# Patient Record
Sex: Female | Born: 1970 | Race: White | Hispanic: No | Marital: Single
Health system: Southern US, Community
[De-identification: ages and names within clinical notes are randomized; demographics above are authoritative.]

## PROBLEM LIST (undated history)

## (undated) DIAGNOSIS — S02609A Fracture of mandible, unspecified, initial encounter for closed fracture: Secondary | ICD-10-CM

## (undated) HISTORY — PX: TEMPOROMANDIBULAR JOINT SURGERY: SHX35

## (undated) HISTORY — PX: TUBAL LIGATION: SHX77

## (undated) HISTORY — PX: MANDIBLE FRACTURE SURGERY: SHX706

---

## 1998-01-22 ENCOUNTER — Emergency Department (HOSPITAL_COMMUNITY): Admission: EM | Admit: 1998-01-22 | Discharge: 1998-01-22 | Payer: Self-pay

## 1998-01-31 ENCOUNTER — Emergency Department (HOSPITAL_COMMUNITY): Admission: EM | Admit: 1998-01-31 | Discharge: 1998-01-31 | Payer: Self-pay | Admitting: Emergency Medicine

## 1998-04-18 ENCOUNTER — Emergency Department (HOSPITAL_COMMUNITY): Admission: EM | Admit: 1998-04-18 | Discharge: 1998-04-18 | Payer: Self-pay | Admitting: Emergency Medicine

## 1998-07-03 ENCOUNTER — Emergency Department (HOSPITAL_COMMUNITY): Admission: EM | Admit: 1998-07-03 | Discharge: 1998-07-03 | Payer: Self-pay | Admitting: Emergency Medicine

## 1998-07-03 ENCOUNTER — Encounter: Payer: Self-pay | Admitting: Emergency Medicine

## 1998-09-02 ENCOUNTER — Other Ambulatory Visit: Admission: RE | Admit: 1998-09-02 | Discharge: 1998-09-02 | Payer: Self-pay | Admitting: Obstetrics and Gynecology

## 1998-10-27 ENCOUNTER — Ambulatory Visit (HOSPITAL_COMMUNITY): Admission: RE | Admit: 1998-10-27 | Discharge: 1998-10-27 | Payer: Self-pay | Admitting: Obstetrics and Gynecology

## 1998-12-03 ENCOUNTER — Encounter: Payer: Self-pay | Admitting: Obstetrics and Gynecology

## 1998-12-03 ENCOUNTER — Ambulatory Visit (HOSPITAL_COMMUNITY): Admission: RE | Admit: 1998-12-03 | Discharge: 1998-12-03 | Payer: Self-pay | Admitting: Obstetrics and Gynecology

## 1999-01-08 ENCOUNTER — Inpatient Hospital Stay (HOSPITAL_COMMUNITY): Admission: AD | Admit: 1999-01-08 | Discharge: 1999-01-08 | Payer: Self-pay | Admitting: Obstetrics and Gynecology

## 1999-02-09 ENCOUNTER — Inpatient Hospital Stay (HOSPITAL_COMMUNITY): Admission: AD | Admit: 1999-02-09 | Discharge: 1999-02-09 | Payer: Self-pay | Admitting: *Deleted

## 1999-03-17 ENCOUNTER — Inpatient Hospital Stay (HOSPITAL_COMMUNITY): Admission: AD | Admit: 1999-03-17 | Discharge: 1999-03-17 | Payer: Self-pay | Admitting: Obstetrics and Gynecology

## 1999-03-20 ENCOUNTER — Inpatient Hospital Stay (HOSPITAL_COMMUNITY): Admission: AD | Admit: 1999-03-20 | Discharge: 1999-03-20 | Payer: Self-pay | Admitting: Obstetrics & Gynecology

## 1999-03-21 ENCOUNTER — Inpatient Hospital Stay (HOSPITAL_COMMUNITY): Admission: AD | Admit: 1999-03-21 | Discharge: 1999-03-24 | Payer: Self-pay | Admitting: *Deleted

## 1999-07-03 ENCOUNTER — Emergency Department (HOSPITAL_COMMUNITY): Admission: EM | Admit: 1999-07-03 | Discharge: 1999-07-03 | Payer: Self-pay | Admitting: *Deleted

## 2000-06-11 ENCOUNTER — Emergency Department (HOSPITAL_COMMUNITY): Admission: EM | Admit: 2000-06-11 | Discharge: 2000-06-11 | Payer: Self-pay | Admitting: Emergency Medicine

## 2000-10-23 ENCOUNTER — Emergency Department (HOSPITAL_COMMUNITY): Admission: EM | Admit: 2000-10-23 | Discharge: 2000-10-23 | Payer: Self-pay | Admitting: Emergency Medicine

## 2001-04-17 ENCOUNTER — Emergency Department (HOSPITAL_COMMUNITY): Admission: EM | Admit: 2001-04-17 | Discharge: 2001-04-17 | Payer: Self-pay | Admitting: Emergency Medicine

## 2001-12-18 ENCOUNTER — Emergency Department (HOSPITAL_COMMUNITY): Admission: EM | Admit: 2001-12-18 | Discharge: 2001-12-18 | Payer: Self-pay | Admitting: Emergency Medicine

## 2002-06-05 ENCOUNTER — Emergency Department (HOSPITAL_COMMUNITY): Admission: EM | Admit: 2002-06-05 | Discharge: 2002-06-05 | Payer: Self-pay | Admitting: Emergency Medicine

## 2003-04-12 ENCOUNTER — Emergency Department (HOSPITAL_COMMUNITY): Admission: EM | Admit: 2003-04-12 | Discharge: 2003-04-12 | Payer: Self-pay | Admitting: Emergency Medicine

## 2003-11-22 ENCOUNTER — Inpatient Hospital Stay (HOSPITAL_COMMUNITY): Admission: AD | Admit: 2003-11-22 | Discharge: 2003-11-22 | Payer: Self-pay | Admitting: Obstetrics and Gynecology

## 2003-12-30 ENCOUNTER — Ambulatory Visit (HOSPITAL_COMMUNITY): Admission: RE | Admit: 2003-12-30 | Discharge: 2003-12-30 | Payer: Self-pay | Admitting: Obstetrics and Gynecology

## 2004-02-02 ENCOUNTER — Inpatient Hospital Stay (HOSPITAL_COMMUNITY): Admission: AD | Admit: 2004-02-02 | Discharge: 2004-02-04 | Payer: Self-pay | Admitting: Obstetrics and Gynecology

## 2004-02-03 ENCOUNTER — Encounter (INDEPENDENT_AMBULATORY_CARE_PROVIDER_SITE_OTHER): Payer: Self-pay | Admitting: Specialist

## 2004-04-14 ENCOUNTER — Emergency Department (HOSPITAL_COMMUNITY): Admission: EM | Admit: 2004-04-14 | Discharge: 2004-04-14 | Payer: Self-pay | Admitting: Family Medicine

## 2004-11-02 ENCOUNTER — Emergency Department (HOSPITAL_COMMUNITY): Admission: EM | Admit: 2004-11-02 | Discharge: 2004-11-02 | Payer: Self-pay | Admitting: Emergency Medicine

## 2005-06-01 IMAGING — US US OB FOLLOW-UP
1 series · 13 of 28 positions shown · non-contrast
Comparison: none

CLINICAL DATA: Size less than dates.  Question IUGR.

[Series 1: unknown · 0.32mm/px · 13 of 44 slices shown]
[im 2/44]
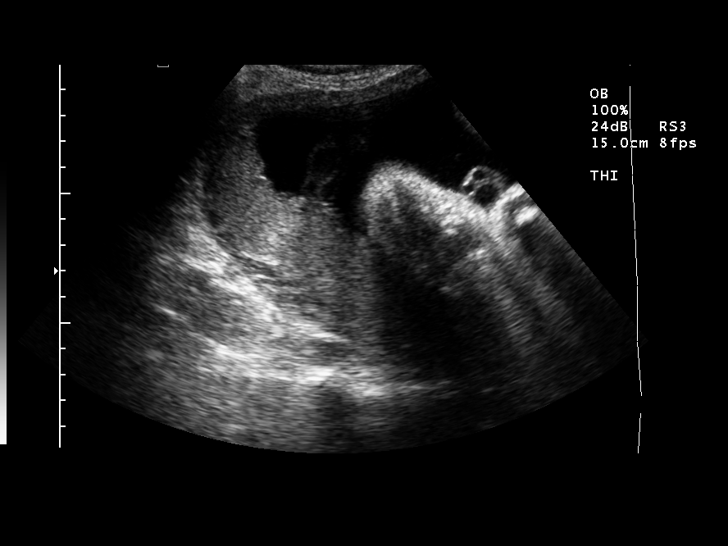
[im 5/44]
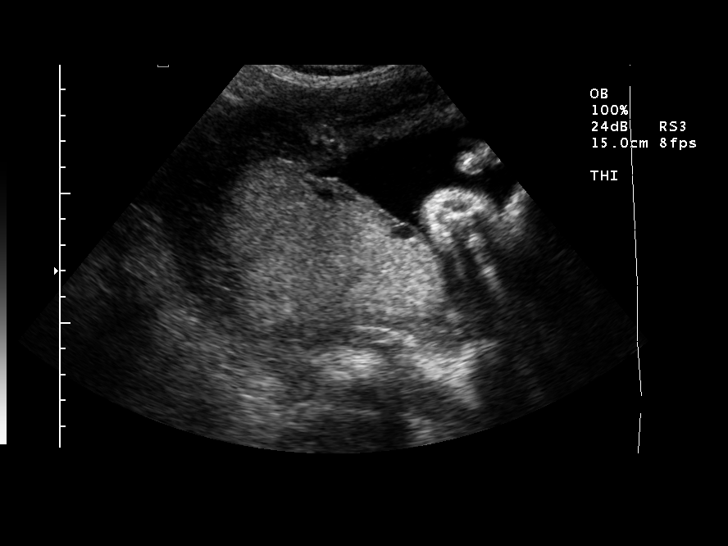
[im 8/44]
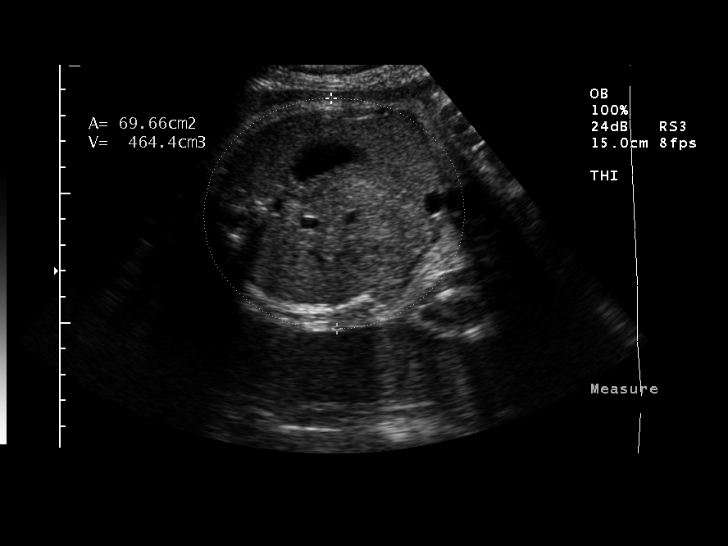
[im 12/44]
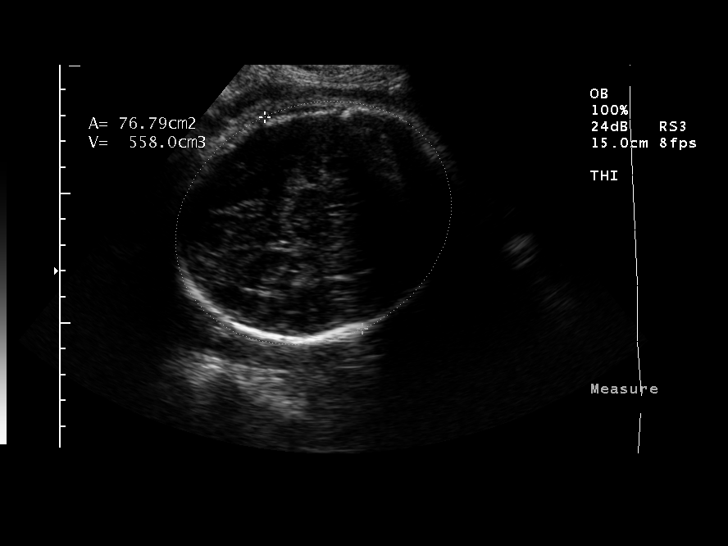
[im 15/44]
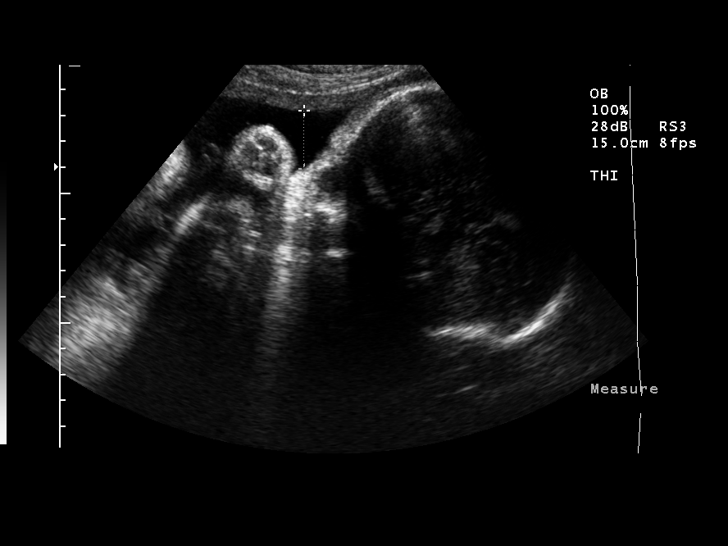
[im 18/44]
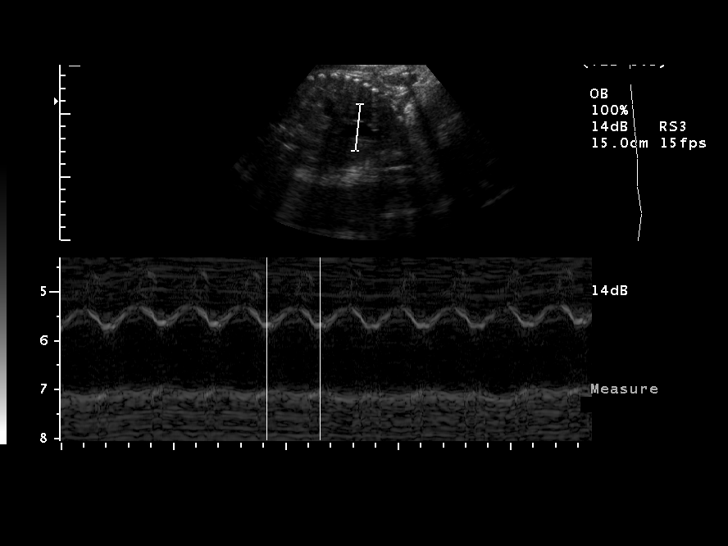
[im 23/44]
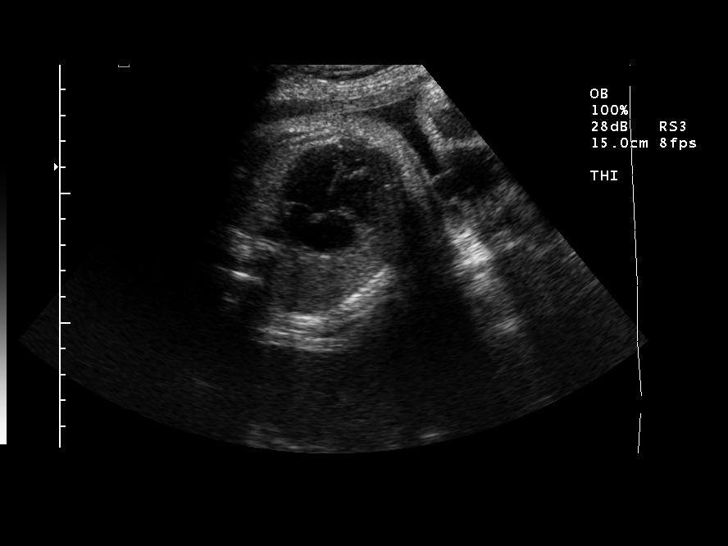
[im 26/44]
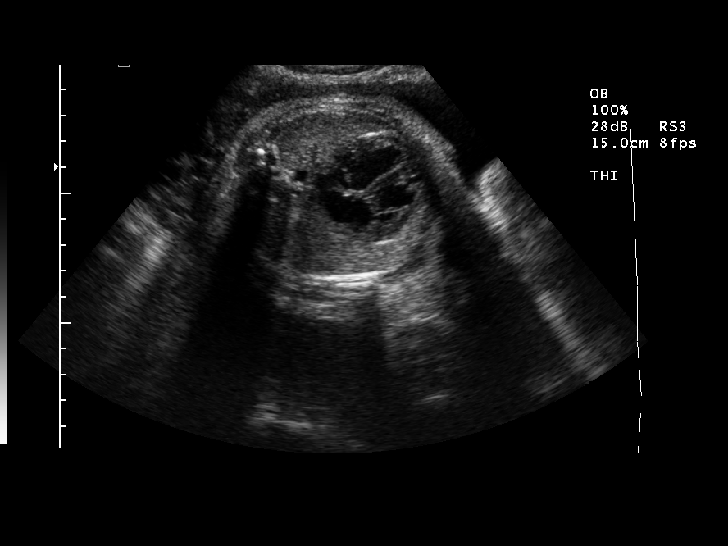
[im 29/44]
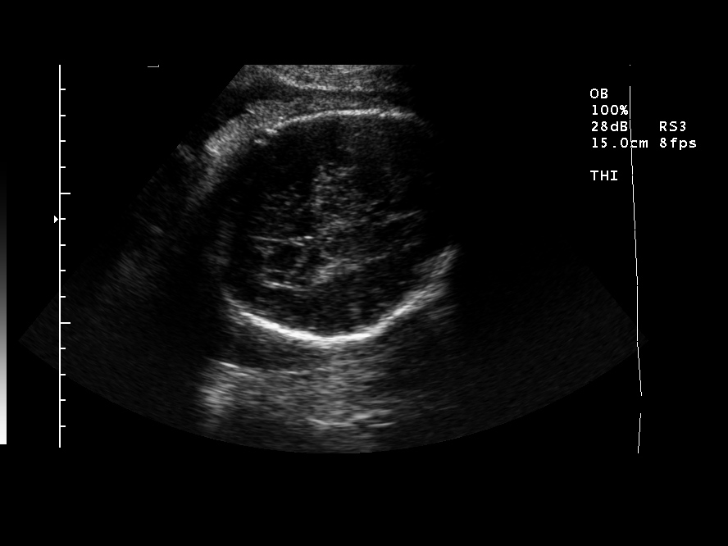
[im 32/44]
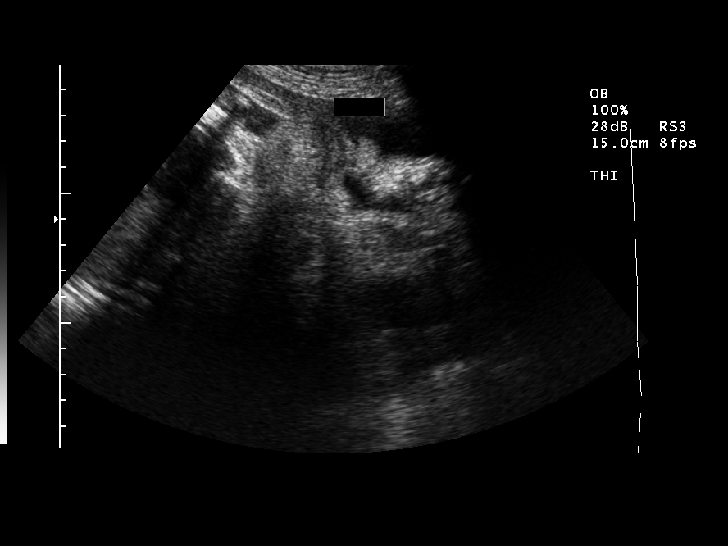
[im 36/44]
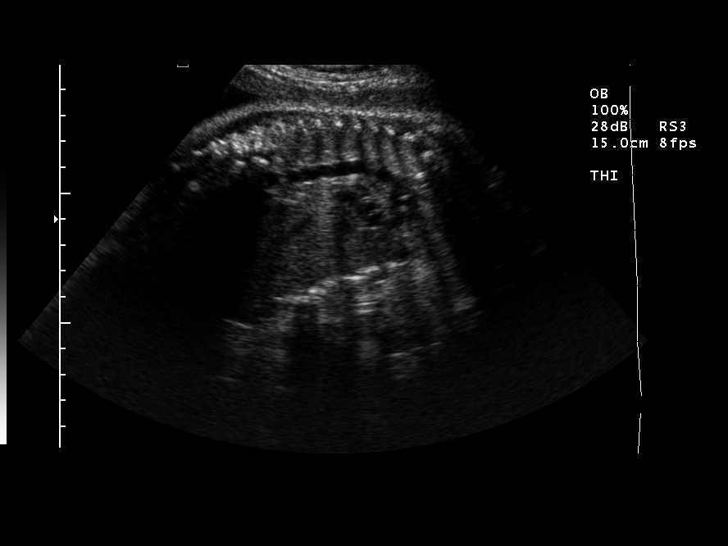
[im 39/44]
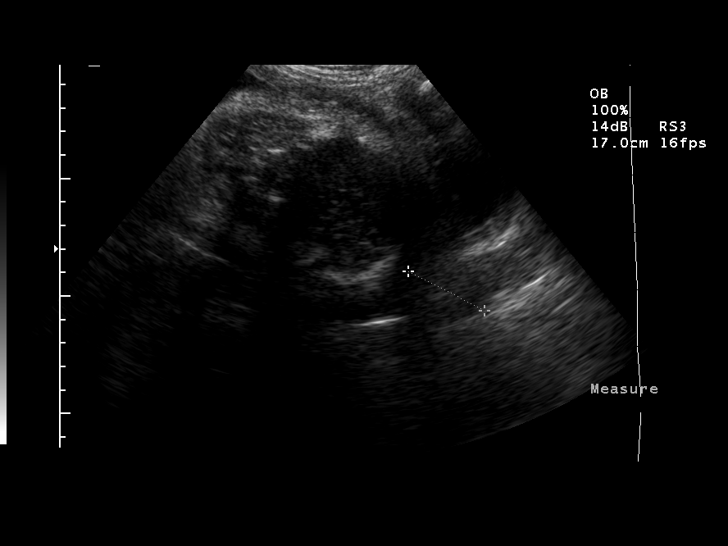
[im 42/44]
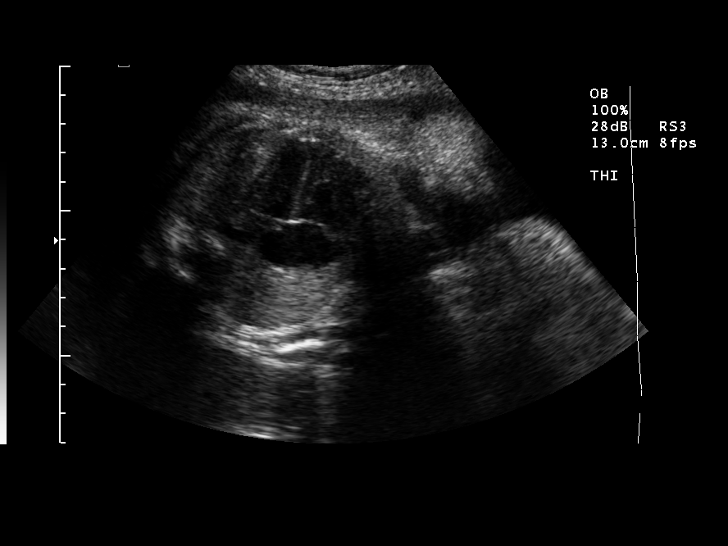

[13 of 28 positions shown; findings below may reference images not displayed]

OBSTETRICAL ULTRASOUND RE-EVALUATION
 Number of Fetuses:  1
 Heart Rate:  127
 Movement:  Yes
 Breathing:  Yes
 Presentation:  Cephalic 
 Placental Location:  Fundal, posterior
 Grade:  I
 Previa:  No
 Amniotic Fluid (subjective):  Normal
 Amniotic Fluid (objective):  15.5 cm AFI (5th -95th%ile =   8.3 – 24.5 cm for 33 wks)

 FETAL BIOMETRY
 BPD:  8.6 cm   34 w 6 d
 HC:  31.4 cm   35 w 2 d
 AC:  29.7 cm   33 w 5 d
 FL:   6.3 cm   33 w 0 d 

 Mean GA:  34 w 2 d
 Assigned GA:  33 w 3 d

 EFW:  0000 g (H) 75th – 90th%ile (4535 – 4040 g) for 33 wks

 FETAL ANATOMY
 Lateral Ventricles:  Visualized 
 Thalami/CSP:  Visualized 
 Posterior Fossa:  Visualized 
 Nuchal Region:  N/A
 Spine:  Previously seen 
 4 Chamber Heart on Left:  Visualized 
 Stomach on Left:  Visualized 
 3 Vessel Cord:  Visualized 
 Cord Insertion Site:  Previously seen 
 Kidneys:  Visualized 
 Bladder:  Visualized 
 Extremities:  Previously seen 

 ADDITIONAL ANATOMY VISUALIZED:  LVOT, RVOT, upper lip, orbits, diaphragm and male genitalia

 MATERNAL FINDINGS
 Cervix:  3.6 cm Transabdominally

 BIOPHYSICAL PROFILE

 Movement:  2    Time:  20 minutes
 Breathing:  2
 Tone:  2
 Amniotic Fluid:  2

 Total Score:  8
IMPRESSION: Single living intrauterine fetus in cephalic presentation with subjectively and quantitatively normal amniotic fluid volume.  Estimated gestational age by fetal biometry is 34 weeks 2 days which is within one week of the reported assigned gestational age of 33 weeks 3 days.  Best estimated fetal weight using the Srushti Gamel is 0000 grams which would place the fetus in the 75th – 90th percentile.
 Biophysical profile score is [DATE] after 20 minutes.

## 2006-09-25 ENCOUNTER — Emergency Department (HOSPITAL_COMMUNITY): Admission: EM | Admit: 2006-09-25 | Discharge: 2006-09-25 | Payer: Self-pay | Admitting: Emergency Medicine

## 2006-12-20 ENCOUNTER — Emergency Department (HOSPITAL_COMMUNITY): Admission: EM | Admit: 2006-12-20 | Discharge: 2006-12-20 | Payer: Self-pay | Admitting: Emergency Medicine

## 2009-10-20 ENCOUNTER — Emergency Department (HOSPITAL_COMMUNITY): Admission: EM | Admit: 2009-10-20 | Discharge: 2009-10-20 | Payer: Self-pay | Admitting: Emergency Medicine

## 2010-09-01 ENCOUNTER — Inpatient Hospital Stay (INDEPENDENT_AMBULATORY_CARE_PROVIDER_SITE_OTHER)
Admission: RE | Admit: 2010-09-01 | Discharge: 2010-09-01 | Disposition: A | Discharge status: Home / Self Care | Payer: Self-pay | Source: Ambulatory Visit | Attending: Family Medicine | Admitting: Family Medicine

## 2010-09-01 ENCOUNTER — Emergency Department (HOSPITAL_COMMUNITY)
Admission: EM | Admit: 2010-09-01 | Discharge: 2010-09-01 | Discharge status: Against Medical Advice | Payer: Self-pay | Attending: Emergency Medicine | Admitting: Emergency Medicine

## 2010-09-01 DIAGNOSIS — N926 Irregular menstruation, unspecified: Secondary | ICD-10-CM

## 2010-09-01 DIAGNOSIS — R079 Chest pain, unspecified: Secondary | ICD-10-CM | POA: Insufficient documentation

## 2010-09-01 LAB — POCT I-STAT, CHEM 8
BUN: 5 mg/dL — ABNORMAL LOW (ref 6–23)
Chloride: 103 mEq/L (ref 96–112)
Creatinine, Ser: 0.8 mg/dL (ref 0.4–1.2)
HCT: 33 % — ABNORMAL LOW (ref 36.0–46.0)
Hemoglobin: 11.2 g/dL — ABNORMAL LOW (ref 12.0–15.0)
Potassium: 4.1 mEq/L (ref 3.5–5.1)
TCO2: 24 mmol/L (ref 0–100)

## 2010-09-22 LAB — POCT URINALYSIS DIP (DEVICE)
Protein, ur: NEGATIVE mg/dL
Specific Gravity, Urine: 1.01 (ref 1.005–1.030)
Urobilinogen, UA: 0.2 mg/dL (ref 0.0–1.0)

## 2010-09-22 LAB — GC/CHLAMYDIA PROBE AMP, GENITAL: Chlamydia, DNA Probe: NEGATIVE

## 2010-09-22 LAB — WET PREP, GENITAL
Clue Cells Wet Prep HPF POC: NONE SEEN
Yeast Wet Prep HPF POC: NONE SEEN

## 2010-09-22 LAB — POCT PREGNANCY, URINE: Preg Test, Ur: NEGATIVE

## 2010-11-20 NOTE — Discharge Summary (Signed)
NAME:  Janice Smith, Janice Smith                            ACCOUNT NO.:  [id number]   MEDICAL RECORD NO.:  [id number]                   PATIENT TYPE:  INP   LOCATION:  9132                                 FACILITY:  [facility]   PHYSICIAN:  Janine Limbo, M.D.            DATE OF BIRTH:  September 29, 1970   DATE OF ADMISSION:  02/02/2004  DATE OF DISCHARGE:  02/04/2004                                 DISCHARGE SUMMARY   ADMISSION DIAGNOSES:  1. Intrauterine pregnancy  at 38-1/7 weeks.  2. Early labor.   DISCHARGE DIAGNOSES:  1. Intrauterine pregnancy at 38-1/7 weeks.  2. Early labor.  3. Desires elective tubal ligation.  4. Status post spontaneous vaginal birth and status post elective bilateral     tubal ligation.  5. Tooth abscess.  6. Pinworm infection.   HOSPITAL PROCEDURES:  1. Epidural anesthesia.  2. Amniotomy.  3. Spontaneous vaginal delivery of viable female infant named Iantha Fallen weighing     7 pounds 0 ounces, Apgars 9 and 9.  4. Elective bilateral tubal ligation.   HOSPITAL COURSE:  The patient was admitted in early labor with cervix at 3-4  cm.  Amniotomy was performed and labor progressed, relatively soon  thereafter to a spontaneous vaginal birth of a viable female infant weighing 7  pounds, Apgars 9 and 9 over an intact perineum.  EBL was 100 cubic  centimeters.  Postpartum day #1, the patient was doing well.  Her elective  tubal ligation was performed under epidural anesthesia without  complications.  Hemoglobin was 10.5, and routine postpartum care was given.  On postpartum day #2 she was doing well and ready to go home.  Vital signs  were stable.  She was afebrile.  Heart:  Regular rate and rhythm.  Chest:  Clear.  Abdomen:  Soft and appropriately tender.  Incision was clean, dry,  and intact.  Lochia small.  Extremities:  Within normal limits.  She was  deemed to have received the full benefit of her hospital stay and was  discharged home.   DISCHARGE MEDICATIONS:  1. Motrin 600  mg p.o. q.6h. p.r.n.  2. Tylox 1-2 p.o. q.4h. p.r.n.  3. Amoxicillin 500 mg p.o. b.i.d. x7 days.  4. Vermox 1 p.o. daily x1, repeat in two weeks.   DISCHARGE INSTRUCTIONS:  Per [facility] handout.   DISCHARGE FOLLOWUP:  To include the patient will make her own dental  appointment as soon as possible, then the patient will follow up in [place] in  six weeks or p.r.n.     Marie L. Williams, C.N.M.                 Janine Limbo, M.D.    MLW/MEDQ  D:  02/04/2004  T:  02/04/2004  Job:  [id number]

## 2010-11-20 NOTE — Op Note (Signed)
NAME:  Janice Smith, Janice Smith                            ACCOUNT NO.:  [id number]   MEDICAL RECORD NO.:  [id number]                   PATIENT TYPE:  INP   LOCATION:  9132                                 [facility]ACILITY:  [facility]   PHYSICIAN:  Crist Fat. Rivard, M.D.              DATE OF BIRTH:  11/27/1970   DATE OF PROCEDURE:  02/03/2004  DATE OF DISCHARGE:                                 OPERATIVE REPORT   PREOPERATIVE DIAGNOSIS:  Desire for sterilization.   POSTOPERATIVE DIAGNOSIS:  Desire for sterilization.   OPERATION PERFORMED:  Bilateral postpartum tubal ligation.   SURGEON:  Crist Fat. Rivard, M.D.   ANESTHESIA:  Epidural.   ESTIMATED BLOOD LOSS:  Minimal.   DESCRIPTION OF PROCEDURE:  After being informed of the planned procedure  with possible complications including bleeding, infection, irreversibility  as well as failure rate of 1 in 1000, informed consent was obtained.  The  patient was taken to operating room #4 and given epidural anesthesia with  the pre-existing epidural catheter.  She was prepped in sterile fashion.  After assessing adequate level of anesthesia, we infiltrated the umbilical  area using 10 mL of Marcaine 0.25 and performed a semielliptical incision  which was brought down to the fascia.  The fascia was easily identified and  grasped and opened with scissors.  The peritoneum was entered bluntly.  We  were able to identify the left tube and exteriorize the left tube with  Babcock forceps.  Unfortunately, there was complete absence of fimbria but  the tube was seen in its entire length as well as the round ligament which  was separately identified and we were confident of our identification.  The  mesosalpinx was entered with cautery.  We doubly ligated using 0 chromic  twice to sharply remove 1.5 cm isthmic ampullary tube and both stumps were  then cauterized.  Hemostasis was adequate.  We proceeded in the same fashion  on the right side.  The tube was exteriorized until  fimbria were identified  and the tubal ligation was performed in exactly the same manner.  Hemostasis  was adequate.  We then closed the fascia with a running suture of 0 Vicryl  and we closed the skin with a subcuticular suture of 4-0 Monocryl and Steri-  Strips.  Sponge and instrument counts were complete times two.  The  estimated blood loss was minimal.  Procedure was swell tolerated by the  patient who was taken to the recovery room in a well and stable condition.                                               Crist Fat Rivard, M.D.    SAR/MEDQ  D:  02/03/2004  T:  02/03/2004  Job:  [id number]
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
## 2013-11-26 ENCOUNTER — Emergency Department (HOSPITAL_COMMUNITY): Payer: BC Managed Care – PPO

## 2013-11-26 ENCOUNTER — Encounter (HOSPITAL_COMMUNITY): Payer: Self-pay | Admitting: Emergency Medicine

## 2013-11-26 ENCOUNTER — Emergency Department (HOSPITAL_COMMUNITY)
Admission: EM | Admit: 2013-11-26 | Discharge: 2013-11-26 | Disposition: A | Discharge status: Home / Self Care | Payer: BC Managed Care – PPO | Attending: Emergency Medicine | Admitting: Emergency Medicine

## 2013-11-26 DIAGNOSIS — R0789 Other chest pain: Secondary | ICD-10-CM

## 2013-11-26 DIAGNOSIS — R071 Chest pain on breathing: Secondary | ICD-10-CM | POA: Insufficient documentation

## 2013-11-26 DIAGNOSIS — F17200 Nicotine dependence, unspecified, uncomplicated: Secondary | ICD-10-CM | POA: Insufficient documentation

## 2013-11-26 LAB — BASIC METABOLIC PANEL
BUN: 8 mg/dL (ref 6–23)
CALCIUM: 8.7 mg/dL (ref 8.4–10.5)
CHLORIDE: 105 meq/L (ref 96–112)
CO2: 19 meq/L (ref 19–32)
CREATININE: 0.48 mg/dL — AB (ref 0.50–1.10)
GFR calc Af Amer: >90 mL/min (ref >90)
Glucose, Bld: 92 mg/dL (ref 70–99)
POTASSIUM: 3.9 meq/L (ref 3.7–5.3)
SODIUM: 138 meq/L (ref 137–147)

## 2013-11-26 LAB — CBC
HEMATOCRIT: 31.4 % — AB (ref 36.0–46.0)
Hemoglobin: 9.2 g/dL — ABNORMAL LOW (ref 12.0–15.0)
MCH: 19.5 pg — ABNORMAL LOW (ref 26.0–34.0)
MCHC: 29.3 g/dL — ABNORMAL LOW (ref 30.0–36.0)
MCV: 66.7 fL — AB (ref 78.0–100.0)
Platelets: 208 10*3/uL (ref 150–400)
RBC: 4.71 MIL/uL (ref 3.87–5.11)
RDW: 17 % — AB (ref 11.5–15.5)
WBC: 8.6 10*3/uL (ref 4.0–10.5)

## 2013-11-26 LAB — I-STAT TROPONIN, ED: TROPONIN I, POC: 0 ng/mL (ref 0.00–0.08)

## 2013-11-26 MED ORDER — CYCLOBENZAPRINE HCL 10 MG PO TABS: 10.0000 mg | ORAL_TABLET | Freq: Two times a day (BID) | ORAL | Status: AC | PRN

## 2013-11-26 MED ORDER — IBUPROFEN 800 MG PO TABS
800.0000 mg | ORAL_TABLET | Freq: Once | ORAL | Status: AC
  Administered 2013-11-26: 800 mg via ORAL
  Filled 2013-11-26: qty 1

## 2013-11-26 MED ORDER — CYCLOBENZAPRINE HCL 10 MG PO TABS
10.0000 mg | ORAL_TABLET | Freq: Once | ORAL | Status: DC
  Filled 2013-11-26: qty 1

## 2013-11-26 NOTE — Discharge Instructions (Signed)

## 2013-11-26 NOTE — ED Provider Notes (Signed)
CSN: [id number]     Arrival date & time 11/26/13  1251 History   First MD Initiated Contact with Patient 11/26/13 1546     Chief Complaint  Patient presents with  . Chest Pain     (Consider location/radiation/quality/duration/timing/severity/associated sxs/prior Treatment) Patient is a 43 y.o. female presenting with chest pain. The history is provided by the patient.  Chest Pain Pain location:  R chest Pain quality: aching and sharp   Pain radiates to:  Does not radiate Pain radiates to the back: no   Pain severity:  Mild Onset quality:  Sudden Duration:  4 hours Timing:  Intermittent Progression:  Waxing and waning Chronicity:  New Context: breathing and movement   Relieved by:  Nothing Worsened by:  Nothing tried Ineffective treatments:  None tried Associated symptoms: no back pain, no cough, no fever, no lower extremity edema, no nausea, no shortness of breath, no syncope, not vomiting and no weakness   Risk factors: no birth control, no coronary artery disease, no diabetes mellitus, no hypertension, not pregnant and no prior DVT/PE     History reviewed. No pertinent past medical history. History reviewed. No pertinent past surgical history. History reviewed. No pertinent family history. History  Substance Use Topics  . Smoking status: Current Every Day Smoker    Types: Cigarettes  . Smokeless tobacco: Not on file  . Alcohol Use: No   OB History   Grav Para Term Preterm Abortions TAB SAB Ect Mult Living                 Review of Systems  Constitutional: Negative for fever.  Respiratory: Negative for cough and shortness of breath.   Cardiovascular: Positive for chest pain. Negative for syncope.  Gastrointestinal: Negative for nausea and vomiting.  Musculoskeletal: Negative for back pain.  Neurological: Negative for weakness.  All other systems reviewed and are negative.     Allergies  Review of patient's allergies indicates no known allergies.  Home  Medications   Prior to Admission medications   Medication Sig Start Date End Date Taking? Authorizing Provider  ibuprofen (ADVIL,MOTRIN) 200 MG tablet Take 400 mg by mouth every 6 (six) hours as needed for mild pain.   Yes Historical Provider, MD   BP 107/50  Pulse 75  Temp(Src) 98.3 F (36.8 C) (Oral)  Resp 17  Ht 5\' 4"  (1.626 m)  Wt 117 lb (53.071 kg)  BMI 20.07 kg/m2  SpO2 100%  LMP 11/12/2013 Physical Exam  Constitutional: She is oriented to person, place, and time. She appears well-developed and well-nourished. No distress.  HENT:  Head: Normocephalic.  Eyes: Conjunctivae are normal.  Neck: Neck supple. No tracheal deviation present.  Cardiovascular: Normal rate, regular rhythm and normal heart sounds.  Exam reveals no friction rub.   No murmur heard. Pulmonary/Chest: Effort normal and breath sounds normal. No respiratory distress. She has no wheezes. She has no rales. She exhibits tenderness (over right upper chest wall anteriorly).  Abdominal: Soft. She exhibits no distension. There is no tenderness.  Musculoskeletal: She exhibits no edema.  Neurological: She is alert and oriented to person, place, and time.  Skin: Skin is warm and dry.  Psychiatric: She has a normal mood and affect.    ED Course  Procedures (including critical care time) Labs Review Labs Reviewed  CBC - Abnormal; Notable for the following:    Hemoglobin 9.2 (*)    HCT 31.4 (*)    MCV 66.7 (*)    MCH 19.5 (*)  MCHC 29.3 (*)    RDW 17.0 (*)    All other components within normal limits  BASIC METABOLIC PANEL - Abnormal; Notable for the following:    Creatinine, Ser 0.48 (*)    All other components within normal limits  I-STAT TROPOININ, ED    Imaging Review Dg Chest 2 View  11/26/2013   CLINICAL DATA:  Chest pain  EXAM: CHEST  2 VIEW  COMPARISON:  None.  FINDINGS: Normal heart, mediastinum and hila.  Minor scarring at the lung apices. Lungs are otherwise clear. No pleural effusion. No  pneumothorax.  Bony thorax is intact.  IMPRESSION: No active cardiopulmonary disease.   Electronically Signed   By: Amie Portlandavid  Ormond M.D.   On: 11/26/2013 14:01     EKG Interpretation   Date/Time:  Monday Nov 26 2013 12:55:47 EDT Ventricular Rate:  82 PR Interval:  146 QRS Duration: 86 QT Interval:  370 QTC Calculation: 432 R Axis:   55 Text Interpretation:  Normal sinus rhythm Right atrial enlargement No  significant change since last tracing Confirmed by Anitra LauthPLUNKETT  MD, Alphonzo LemmingsWHITNEY  [phone]([id number]) on 11/26/2013 5:02:28 PM      MDM   Final diagnoses:  Chest wall pain   43 y.o. female presents with right upper anterior chest wall pain that started this morning after she reached around to wipe in the bathroom. She has never had this type of pain before but describes it as feeling like a pulled muscle. It is tender to palpation him on her right upper chest. She is perc negative. First troponin is negative. Very atypical pain for ACS. Does not radiate so doubt dissection or other emergent cause of chest pain. EKG without ST or T wave changes concerning for myocardial ischemia. No delta wave, no prolonged QTc, no brugada to suggest arrhythmogenicity.    Most likely cause is musculoskeletal pain related to intercostal muscle strain or other inflammatory process. Provided Flexeril and ibuprofen in the emergency department and asked to schedule NSAIDs with a few tablets of Flexeril over the next few days and to increase range of motion and activity for definitive management. Return emergency department with worsening, or other new concerning signs  Lyndal Pulleyaniel Kayde Warehime, MD 11/26/13 1723

## 2013-11-26 NOTE — ED Notes (Signed)
Pt reports right side chest since 10am. Pain increases with breathing, denies cough. No resp distress noted, ekg done at triage.

## 2013-11-26 NOTE — ED Notes (Signed)
Pt brought back to room via wheelchair; pt getting undressed and into a gown at this time; Amy, NT in room with pt; family is in the waiting area and will come back once pt is settled

## 2013-11-27 NOTE — ED Provider Notes (Signed)
I saw and evaluated the patient, reviewed the resident's note and I agree with the findings and plan.   EKG Interpretation   Date/Time:  Monday Nov 26 2013 12:55:47 EDT Ventricular Rate:  82 PR Interval:  146 QRS Duration: 86 QT Interval:  370 QTC Calculation: 432 R Axis:   55 Text Interpretation:  Normal sinus rhythm Right atrial enlargement No  significant change since last tracing Confirmed by Anitra Lauth  MD, Alphonzo Lemmings  [phone]) on 11/26/2013 5:02:28 PM      I have reviewed EKG and agree with the resident interpretation.  you Pt with atypical pleuritic type chest pain after recent coughing for the last week.  No fever or wheezing.  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  Also hx of MI with stents but states pain does not feel like heart pain.  Labs all wnl.  EKG unchanged.  Pt's delta trop neg.  Will d/c home with msk chest pain.  Gwyneth Sprout, MD 11/27/13 1404

## 2014-05-09 ENCOUNTER — Emergency Department (HOSPITAL_BASED_OUTPATIENT_CLINIC_OR_DEPARTMENT_OTHER)
Admission: EM | Admit: 2014-05-09 | Discharge: 2014-05-09 | Disposition: A | Discharge status: Home / Self Care | Payer: BC Managed Care – PPO | Attending: Emergency Medicine | Admitting: Emergency Medicine

## 2014-05-09 ENCOUNTER — Encounter (HOSPITAL_BASED_OUTPATIENT_CLINIC_OR_DEPARTMENT_OTHER): Payer: Self-pay | Admitting: *Deleted

## 2014-05-09 DIAGNOSIS — Z720 Tobacco use: Secondary | ICD-10-CM | POA: Diagnosis not present

## 2014-05-09 DIAGNOSIS — R6884 Jaw pain: Secondary | ICD-10-CM | POA: Diagnosis present

## 2014-05-09 DIAGNOSIS — K088 Other specified disorders of teeth and supporting structures: Secondary | ICD-10-CM | POA: Diagnosis not present

## 2014-05-09 DIAGNOSIS — Z9889 Other specified postprocedural states: Secondary | ICD-10-CM | POA: Insufficient documentation

## 2014-05-09 DIAGNOSIS — K0889 Other specified disorders of teeth and supporting structures: Secondary | ICD-10-CM

## 2014-05-09 MED ORDER — PENICILLIN V POTASSIUM 250 MG PO TABS: 250.0000 mg | ORAL_TABLET | Freq: Four times a day (QID) | ORAL | Status: AC

## 2014-05-09 MED ORDER — HYDROCODONE-ACETAMINOPHEN 5-325 MG PO TABS: 1.0000 | ORAL_TABLET | ORAL | Status: DC | PRN

## 2014-05-09 NOTE — Discharge Instructions (Signed)
Dental Pain  Toothache is pain in or around a tooth. It may get worse with chewing or with cold or heat.   HOME CARE  · Your dentist may use a numbing medicine during treatment. If so, you may need to avoid eating until the medicine wears off. Ask your dentist about this.  · Only take medicine as told by your dentist or doctor.  · Avoid chewing food near the painful tooth until after all treatment is done. Ask your dentist about this.  GET HELP RIGHT AWAY IF:   · The problem gets worse or new problems appear.  · You have a fever.  · There is redness and puffiness (swelling) of the face, jaw, or neck.  · You cannot open your mouth.  · There is pain in the jaw.  · There is very bad pain that is not helped by medicine.  MAKE SURE YOU:   · Understand these instructions.  · Will watch your condition.  · Will get help right away if you are not doing well or get worse.  Document Released: 12/08/2007 Document Revised: 09/13/2011 Document Reviewed: 12/08/2007  Exit[employer]® Patient Information ©2015 [employer]. This information is not intended to replace advice given to you by your health care provider. Make sure you discuss any questions you have with your health care provider.

## 2014-05-09 NOTE — ED Notes (Addendum)
Directed to pharmacy to pick up RX 
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
## 2014-05-09 NOTE — ED Notes (Signed)
Jaw pain x 2 weeks. 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

## 2015-04-29 IMAGING — CR DG CHEST 2V
2 series · 2 of 2 positions shown · non-contrast
Comparison: None.

CLINICAL DATA: Chest pain

EXAM:
CHEST  2 VIEW

[w chest pa]
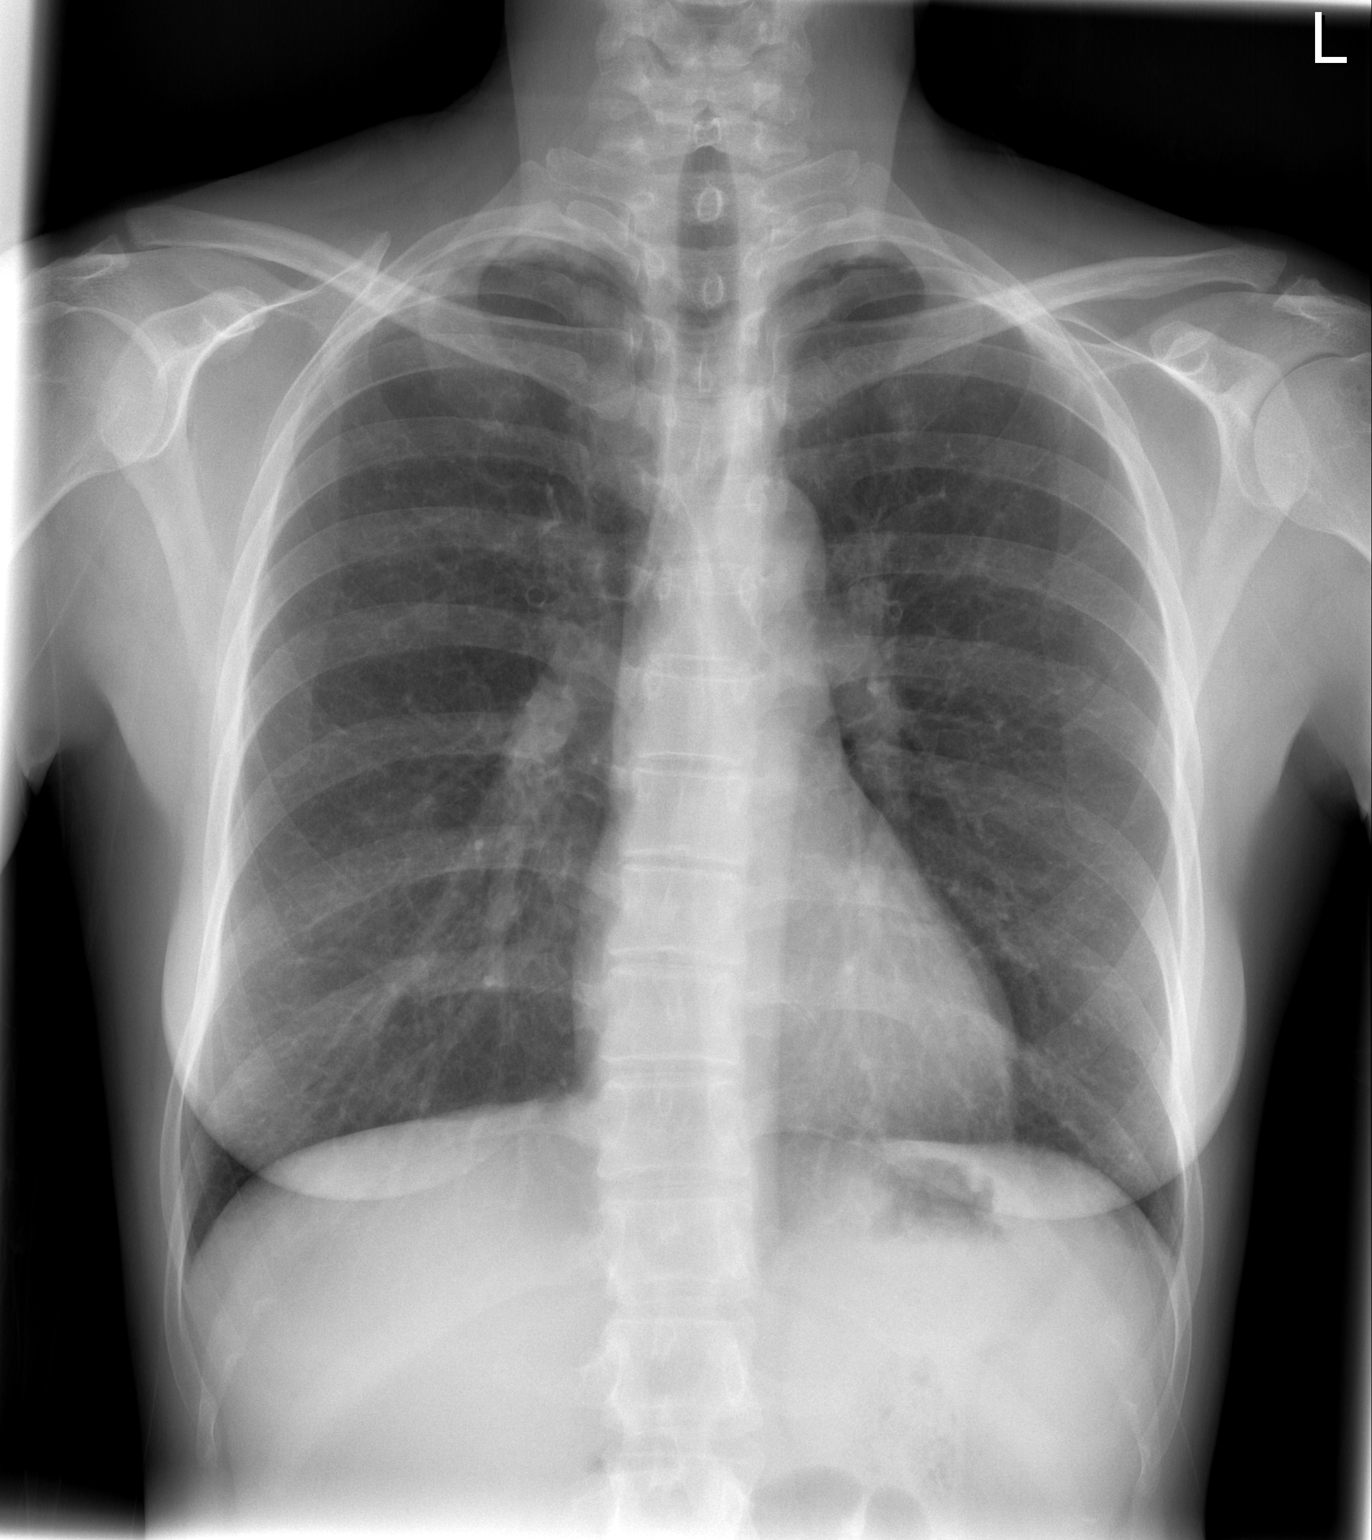

[w chest lat]
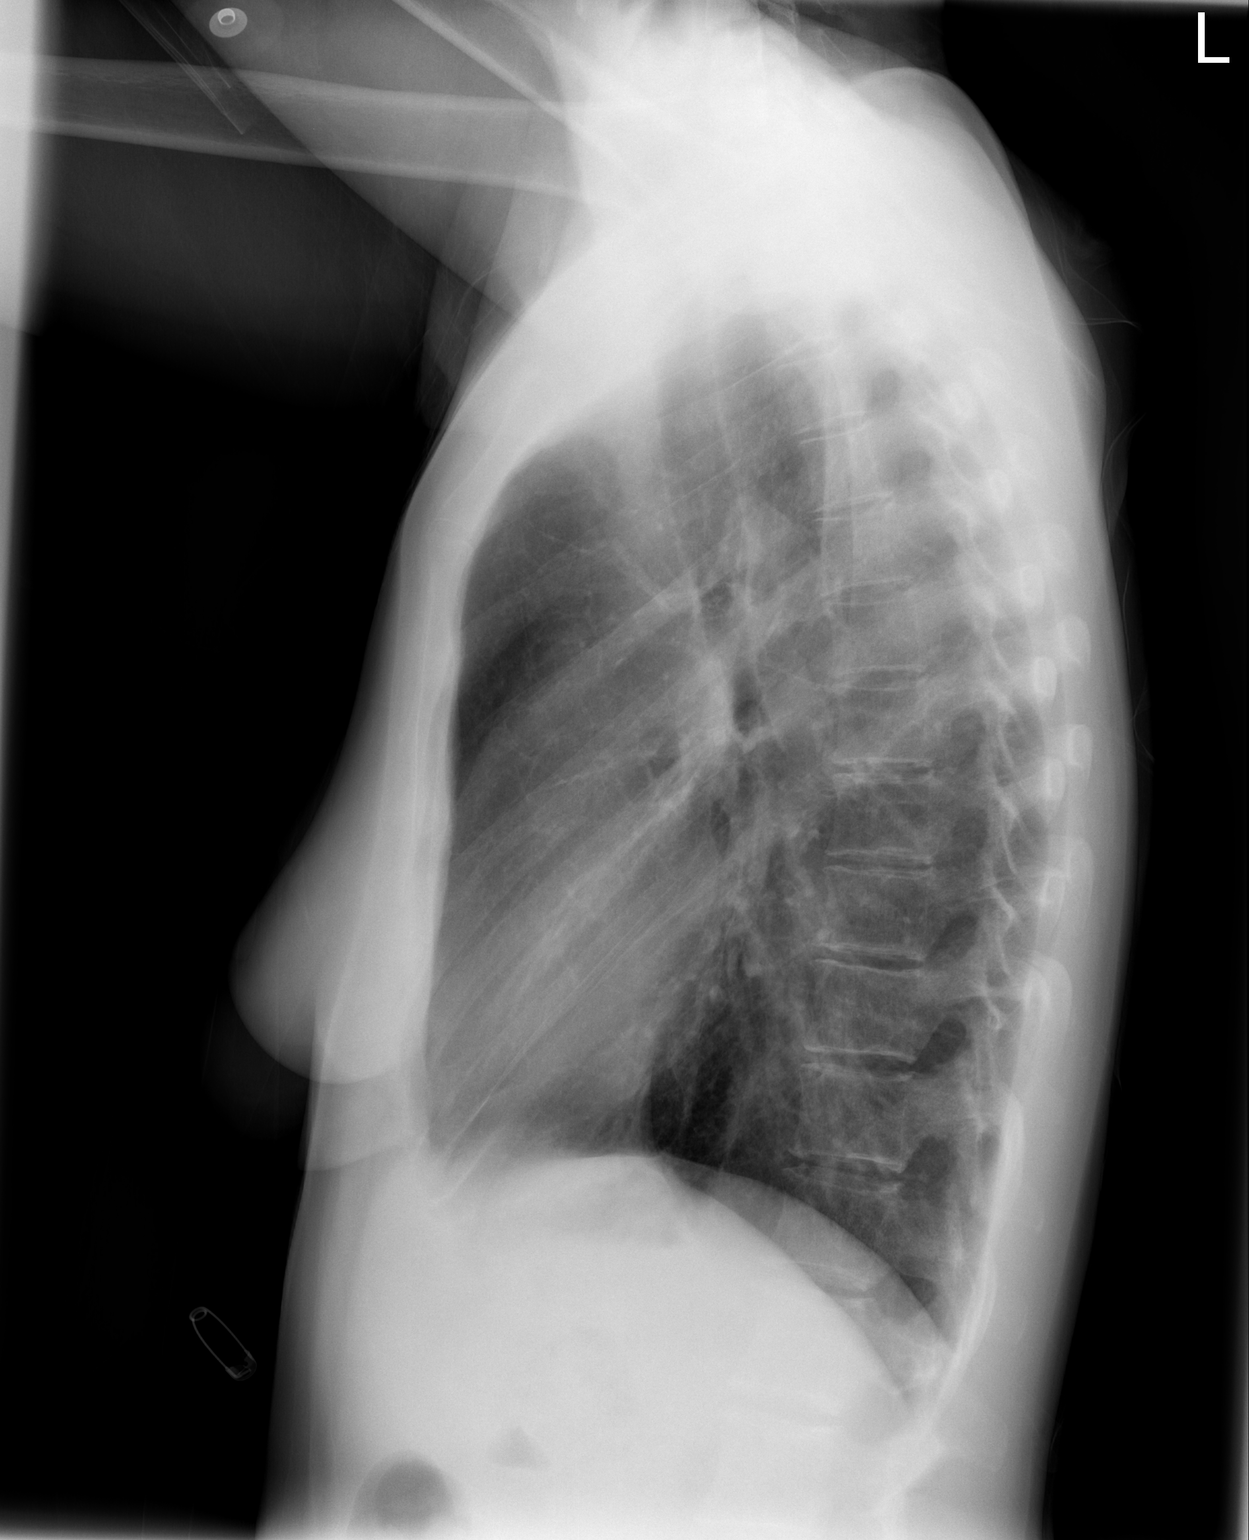

[2 of 2 positions shown; findings below may reference images not displayed]

FINDINGS: Normal heart, mediastinum and hila.

Minor scarring at the lung apices. Lungs are otherwise clear. No
pleural effusion. No pneumothorax.

Bony thorax is intact.
IMPRESSION: No active cardiopulmonary disease.

## 2016-05-08 ENCOUNTER — Emergency Department (HOSPITAL_BASED_OUTPATIENT_CLINIC_OR_DEPARTMENT_OTHER)
Admission: EM | Admit: 2016-05-08 | Discharge: 2016-05-08 | Disposition: A | Discharge status: Home / Self Care | Payer: Medicaid Other | Attending: Emergency Medicine | Admitting: Emergency Medicine

## 2016-05-08 ENCOUNTER — Encounter (HOSPITAL_BASED_OUTPATIENT_CLINIC_OR_DEPARTMENT_OTHER): Payer: Self-pay | Admitting: *Deleted

## 2016-05-08 ENCOUNTER — Emergency Department (HOSPITAL_BASED_OUTPATIENT_CLINIC_OR_DEPARTMENT_OTHER): Payer: Medicaid Other

## 2016-05-08 DIAGNOSIS — R220 Localized swelling, mass and lump, head: Secondary | ICD-10-CM | POA: Diagnosis present

## 2016-05-08 DIAGNOSIS — K047 Periapical abscess without sinus: Secondary | ICD-10-CM | POA: Diagnosis not present

## 2016-05-08 DIAGNOSIS — F17210 Nicotine dependence, cigarettes, uncomplicated: Secondary | ICD-10-CM | POA: Insufficient documentation

## 2016-05-08 HISTORY — DX: Fracture of mandible, unspecified, initial encounter for closed fracture: S02.609A

## 2016-05-08 LAB — CBC WITH DIFFERENTIAL/PLATELET
BASOS ABS: 0.1 10*3/uL (ref 0.0–0.1)
Basophils Relative: 1 %
Eosinophils Absolute: 0.2 10*3/uL (ref 0.0–0.7)
Eosinophils Relative: 2 %
HCT: 28.2 % — ABNORMAL LOW (ref 36.0–46.0)
HEMOGLOBIN: 7.9 g/dL — AB (ref 12.0–15.0)
LYMPHS ABS: 1.7 10*3/uL (ref 0.7–4.0)
LYMPHS PCT: 19 %
MCH: 18.2 pg — ABNORMAL LOW (ref 26.0–34.0)
MCHC: 28 g/dL — ABNORMAL LOW (ref 30.0–36.0)
MCV: 64.8 fL — ABNORMAL LOW (ref 78.0–100.0)
MONO ABS: 0.7 10*3/uL (ref 0.1–1.0)
Monocytes Relative: 8 %
NEUTROS ABS: 6.2 10*3/uL (ref 1.7–7.7)
Neutrophils Relative %: 70 %
PLATELETS: 194 10*3/uL (ref 150–400)
RBC: 4.35 MIL/uL (ref 3.87–5.11)
RDW: 19 % — ABNORMAL HIGH (ref 11.5–15.5)
WBC: 8.8 10*3/uL (ref 4.0–10.5)

## 2016-05-08 LAB — BASIC METABOLIC PANEL
ANION GAP: 7 (ref 5–15)
BUN: 8 mg/dL (ref 6–20)
CHLORIDE: 106 mmol/L (ref 101–111)
CO2: 22 mmol/L (ref 22–32)
Calcium: 8.9 mg/dL (ref 8.9–10.3)
Creatinine, Ser: 0.56 mg/dL (ref 0.44–1.00)
GFR calc Af Amer: >60 mL/min (ref >60)
Glucose, Bld: 105 mg/dL — ABNORMAL HIGH (ref 65–99)
POTASSIUM: 3.5 mmol/L (ref 3.5–5.1)
SODIUM: 135 mmol/L (ref 135–145)

## 2016-05-08 MED ORDER — CLINDAMYCIN PHOSPHATE 600 MG/50ML IV SOLN
600.0000 mg | Freq: Once | INTRAVENOUS | Status: AC
  Administered 2016-05-08: 600 mg via INTRAVENOUS
  Filled 2016-05-08: qty 50

## 2016-05-08 MED ORDER — FENTANYL CITRATE (PF) 100 MCG/2ML IJ SOLN
100.0000 ug | Freq: Once | INTRAMUSCULAR | Status: AC
  Administered 2016-05-08: 100 ug via INTRAVENOUS
  Filled 2016-05-08: qty 2

## 2016-05-08 MED ORDER — PENICILLIN V POTASSIUM 500 MG PO TABS: 1000.0000 mg | ORAL_TABLET | Freq: Two times a day (BID) | ORAL | 0 refills | Status: AC

## 2016-05-08 MED ORDER — IOPAMIDOL (ISOVUE-300) INJECTION 61%
100.0000 mL | Freq: Once | INTRAVENOUS | Status: AC | PRN
  Administered 2016-05-08: 100 mL via INTRAVENOUS

## 2016-05-08 MED ORDER — HYDROCODONE-ACETAMINOPHEN 5-325 MG PO TABS: 1.0000 | ORAL_TABLET | ORAL | 0 refills | Status: AC | PRN

## 2016-05-08 MED ORDER — ONDANSETRON HCL 4 MG/2ML IJ SOLN
4.0000 mg | Freq: Once | INTRAMUSCULAR | Status: AC
  Administered 2016-05-08: 4 mg via INTRAVENOUS
  Filled 2016-05-08: qty 2
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
## 2016-05-08 NOTE — ED Notes (Signed)
Patient transported to CT 

## 2019-10-06 ENCOUNTER — Other Ambulatory Visit: Payer: Self-pay

## 2019-10-06 ENCOUNTER — Emergency Department (HOSPITAL_COMMUNITY)
Admission: EM | Admit: 2019-10-06 | Discharge: 2019-10-06 | Disposition: A | Discharge status: Home / Self Care | Payer: Medicaid Other | Attending: Emergency Medicine | Admitting: Emergency Medicine

## 2019-10-06 ENCOUNTER — Encounter (HOSPITAL_COMMUNITY): Payer: Self-pay | Admitting: Emergency Medicine

## 2019-10-06 DIAGNOSIS — Y907 Blood alcohol level of 200-239 mg/100 ml: Secondary | ICD-10-CM | POA: Insufficient documentation

## 2019-10-06 DIAGNOSIS — F10129 Alcohol abuse with intoxication, unspecified: Secondary | ICD-10-CM | POA: Diagnosis present

## 2019-10-06 DIAGNOSIS — F10920 Alcohol use, unspecified with intoxication, uncomplicated: Secondary | ICD-10-CM

## 2019-10-06 DIAGNOSIS — F1010 Alcohol abuse, uncomplicated: Secondary | ICD-10-CM

## 2019-10-06 DIAGNOSIS — F17210 Nicotine dependence, cigarettes, uncomplicated: Secondary | ICD-10-CM | POA: Insufficient documentation

## 2019-10-06 LAB — CBC WITH DIFFERENTIAL/PLATELET
Abs Immature Granulocytes: 0.02 10*3/uL (ref 0.00–0.07)
Basophils Absolute: 0.1 10*3/uL (ref 0.0–0.1)
Basophils Relative: 1 %
Eosinophils Absolute: 0.1 10*3/uL (ref 0.0–0.5)
Eosinophils Relative: 2 %
HCT: 40.1 % (ref 36.0–46.0)
Hemoglobin: 11.8 g/dL — ABNORMAL LOW (ref 12.0–15.0)
Immature Granulocytes: 0 %
Lymphocytes Relative: 41 %
Lymphs Abs: 2.1 10*3/uL (ref 0.7–4.0)
MCH: 22.9 pg — ABNORMAL LOW (ref 26.0–34.0)
MCHC: 29.4 g/dL — ABNORMAL LOW (ref 30.0–36.0)
MCV: 77.9 fL — ABNORMAL LOW (ref 80.0–100.0)
Monocytes Absolute: 0.3 10*3/uL (ref 0.1–1.0)
Monocytes Relative: 6 %
Neutro Abs: 2.6 10*3/uL (ref 1.7–7.7)
Neutrophils Relative %: 50 %
Platelets: 184 10*3/uL (ref 150–400)
RBC: 5.15 MIL/uL — ABNORMAL HIGH (ref 3.87–5.11)
RDW: 17.9 % — ABNORMAL HIGH (ref 11.5–15.5)
WBC: 5.2 10*3/uL (ref 4.0–10.5)
nRBC: 0 % (ref 0.0–0.2)

## 2019-10-06 LAB — BASIC METABOLIC PANEL
Anion gap: 9 (ref 5–15)
BUN: 7 mg/dL (ref 6–20)
CO2: 27 mmol/L (ref 22–32)
Calcium: 8.7 mg/dL — ABNORMAL LOW (ref 8.9–10.3)
Chloride: 106 mmol/L (ref 98–111)
Creatinine, Ser: 0.5 mg/dL (ref 0.44–1.00)
GFR calc Af Amer: >60 mL/min (ref >60)
GFR calc non Af Amer: >60 mL/min (ref >60)
Glucose, Bld: 100 mg/dL — ABNORMAL HIGH (ref 70–99)
Potassium: 3.6 mmol/L (ref 3.5–5.1)
Sodium: 142 mmol/L (ref 135–145)

## 2019-10-06 LAB — ETHANOL: Alcohol, Ethyl (B): 208 mg/dL — ABNORMAL HIGH (ref <10)

## 2019-10-06 LAB — ACETAMINOPHEN LEVEL: Acetaminophen (Tylenol), Serum: <10 ug/mL — ABNORMAL LOW (ref 10–30)

## 2019-10-06 LAB — RAPID URINE DRUG SCREEN, HOSP PERFORMED
Amphetamines: NOT DETECTED
Barbiturates: NOT DETECTED
Benzodiazepines: NOT DETECTED
Cocaine: NOT DETECTED
Opiates: NOT DETECTED
Tetrahydrocannabinol: NOT DETECTED

## 2019-10-06 LAB — SALICYLATE LEVEL: Salicylate Lvl: <7 mg/dL — ABNORMAL LOW (ref 7.0–30.0)

## 2019-10-06 MED ORDER — SODIUM CHLORIDE 0.9 % IV BOLUS
1000.0000 mL | Freq: Once | INTRAVENOUS | Status: AC
  Administered 2019-10-06: 1000 mL via INTRAVENOUS

## 2019-10-06 NOTE — Discharge Instructions (Signed)
Please return for any problem.  Follow-up with your regular care provider as instructed.  Please drink alcohol in moderation. 

## 2019-10-06 NOTE — ED Notes (Signed)
PT ambulated to restroom without assistance, PT carried IV pole, urine specimen collected.

## 2019-10-06 NOTE — ED Triage Notes (Signed)
When EMS arrived to the patient residence, the patient was believed to have overdosed. EMS noted pin point pupils and pale skin. 2 mg of narcan was administered twice before the patient became awake and was then transported to the hospital. The patient has transitioned from being bagged, to a non re breather and then to 2 L nasal canula. Family reported that the patient drank 2 bottles of E&J Brandy.   EMS vitals: 120/74 BP 70 HR 16 RR 99% O2 on 2 L nasal canula 43 ETOC2
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
## 2019-10-06 NOTE — ED Notes (Signed)
PT had bleeding from cut marks on right wrist, cleaned with sterile gauze, and taped secure.

## 2019-12-08 ENCOUNTER — Other Ambulatory Visit: Payer: Self-pay

## 2019-12-08 ENCOUNTER — Emergency Department (HOSPITAL_COMMUNITY): Payer: Medicaid Other

## 2019-12-08 ENCOUNTER — Inpatient Hospital Stay (HOSPITAL_COMMUNITY)
Admission: EM | Admit: 2019-12-08 | Discharge: 2019-12-12 | DRG: 964 | Disposition: A | Discharge status: Home / Self Care | Payer: Medicaid Other | Attending: General Surgery | Admitting: General Surgery

## 2019-12-08 DIAGNOSIS — S36116A Major laceration of liver, initial encounter: Principal | ICD-10-CM | POA: Diagnosis present

## 2019-12-08 DIAGNOSIS — S2249XA Multiple fractures of ribs, unspecified side, initial encounter for closed fracture: Secondary | ICD-10-CM

## 2019-12-08 DIAGNOSIS — Z8249 Family history of ischemic heart disease and other diseases of the circulatory system: Secondary | ICD-10-CM

## 2019-12-08 DIAGNOSIS — S20319A Abrasion of unspecified front wall of thorax, initial encounter: Secondary | ICD-10-CM | POA: Diagnosis present

## 2019-12-08 DIAGNOSIS — Z833 Family history of diabetes mellitus: Secondary | ICD-10-CM

## 2019-12-08 DIAGNOSIS — S36113A Laceration of liver, unspecified degree, initial encounter: Secondary | ICD-10-CM

## 2019-12-08 DIAGNOSIS — S2243XA Multiple fractures of ribs, bilateral, initial encounter for closed fracture: Secondary | ICD-10-CM | POA: Diagnosis present

## 2019-12-08 DIAGNOSIS — J939 Pneumothorax, unspecified: Secondary | ICD-10-CM

## 2019-12-08 DIAGNOSIS — Z20822 Contact with and (suspected) exposure to covid-19: Secondary | ICD-10-CM | POA: Diagnosis present

## 2019-12-08 DIAGNOSIS — Y929 Unspecified place or not applicable: Secondary | ICD-10-CM

## 2019-12-08 DIAGNOSIS — T1490XA Injury, unspecified, initial encounter: Secondary | ICD-10-CM

## 2019-12-08 DIAGNOSIS — D62 Acute posthemorrhagic anemia: Secondary | ICD-10-CM | POA: Diagnosis present

## 2019-12-08 DIAGNOSIS — S272XXA Traumatic hemopneumothorax, initial encounter: Secondary | ICD-10-CM | POA: Diagnosis present

## 2019-12-08 DIAGNOSIS — M549 Dorsalgia, unspecified: Secondary | ICD-10-CM | POA: Diagnosis present

## 2019-12-08 DIAGNOSIS — F17210 Nicotine dependence, cigarettes, uncomplicated: Secondary | ICD-10-CM | POA: Diagnosis present

## 2019-12-08 DIAGNOSIS — S27329A Contusion of lung, unspecified, initial encounter: Secondary | ICD-10-CM | POA: Diagnosis present

## 2019-12-08 LAB — COMPREHENSIVE METABOLIC PANEL
ALT: 90 U/L — ABNORMAL HIGH (ref 0–44)
AST: 170 U/L — ABNORMAL HIGH (ref 15–41)
Albumin: 3.8 g/dL (ref 3.5–5.0)
Alkaline Phosphatase: 50 U/L (ref 38–126)
Anion gap: 12 (ref 5–15)
BUN: 9 mg/dL (ref 6–20)
CO2: 24 mmol/L (ref 22–32)
Calcium: 8.6 mg/dL — ABNORMAL LOW (ref 8.9–10.3)
Chloride: 104 mmol/L (ref 98–111)
Creatinine, Ser: 0.75 mg/dL (ref 0.44–1.00)
GFR calc Af Amer: >60 mL/min (ref >60)
GFR calc non Af Amer: >60 mL/min (ref >60)
Glucose, Bld: 113 mg/dL — ABNORMAL HIGH (ref 70–99)
Potassium: 4.4 mmol/L (ref 3.5–5.1)
Sodium: 140 mmol/L (ref 135–145)
Total Bilirubin: 0.5 mg/dL (ref 0.3–1.2)
Total Protein: 7 g/dL (ref 6.5–8.1)

## 2019-12-08 LAB — LACTIC ACID, PLASMA: Lactic Acid, Venous: 1.7 mmol/L (ref 0.5–1.9)

## 2019-12-08 LAB — I-STAT CHEM 8, ED
BUN: 10 mg/dL (ref 6–20)
Calcium, Ion: 1.09 mmol/L — ABNORMAL LOW (ref 1.15–1.40)
Chloride: 104 mmol/L (ref 98–111)
Creatinine, Ser: 0.7 mg/dL (ref 0.44–1.00)
Glucose, Bld: 106 mg/dL — ABNORMAL HIGH (ref 70–99)
HCT: 35 % — ABNORMAL LOW (ref 36.0–46.0)
Hemoglobin: 11.9 g/dL — ABNORMAL LOW (ref 12.0–15.0)
Potassium: 4.2 mmol/L (ref 3.5–5.1)
Sodium: 141 mmol/L (ref 135–145)
TCO2: 23 mmol/L (ref 22–32)

## 2019-12-08 LAB — CBC
HCT: 36.5 % (ref 36.0–46.0)
Hemoglobin: 10.8 g/dL — ABNORMAL LOW (ref 12.0–15.0)
MCH: 23.3 pg — ABNORMAL LOW (ref 26.0–34.0)
MCHC: 29.6 g/dL — ABNORMAL LOW (ref 30.0–36.0)
MCV: 78.8 fL — ABNORMAL LOW (ref 80.0–100.0)
Platelets: 202 10*3/uL (ref 150–400)
RBC: 4.63 MIL/uL (ref 3.87–5.11)
RDW: 16.4 % — ABNORMAL HIGH (ref 11.5–15.5)
WBC: 15 10*3/uL — ABNORMAL HIGH (ref 4.0–10.5)
nRBC: 0 % (ref 0.0–0.2)

## 2019-12-08 LAB — I-STAT BETA HCG BLOOD, ED (MC, WL, AP ONLY): I-stat hCG, quantitative: <5 m[IU]/mL (ref <5)

## 2019-12-08 LAB — PROTIME-INR
INR: 1 (ref 0.8–1.2)
Prothrombin Time: 13.2 seconds (ref 11.4–15.2)

## 2019-12-08 LAB — ETHANOL: Alcohol, Ethyl (B): <10 mg/dL (ref <10)

## 2019-12-08 LAB — SAMPLE TO BLOOD BANK

## 2019-12-08 MED ORDER — FENTANYL CITRATE (PF) 100 MCG/2ML IJ SOLN
50.0000 ug | INTRAMUSCULAR | Status: AC | PRN
  Administered 2019-12-08: 50 ug via INTRAVENOUS

## 2019-12-08 MED ORDER — IOHEXOL 300 MG/ML  SOLN
100.0000 mL | Freq: Once | INTRAMUSCULAR | Status: AC | PRN
  Administered 2019-12-08: 100 mL via INTRAVENOUS

## 2019-12-08 MED ORDER — FENTANYL CITRATE (PF) 100 MCG/2ML IJ SOLN
50.0000 ug | Freq: Once | INTRAMUSCULAR | Status: AC
  Administered 2019-12-08: 50 ug via INTRAVENOUS
  Filled 2019-12-08: qty 2

## 2019-12-08 MED ORDER — FENTANYL CITRATE (PF) 100 MCG/2ML IJ SOLN: 50.0000 ug | Freq: Once | INTRAMUSCULAR | Status: DC

## 2019-12-08 MED ORDER — SODIUM CHLORIDE 0.9 % IV SOLN
INTRAVENOUS | Status: AC | PRN
  Administered 2019-12-08: 999 mL via INTRAVENOUS

## 2019-12-08 NOTE — ED Provider Notes (Signed)
ATTENDING SUPERVISORY NOTE I have personally viewed the imaging studies performed. I have personally seen and examined the patient, and discussed the plan of care with the resident.  I have reviewed the documentation of the resident and agree.  Trauma - Plan: DG Chest [place] 1 [address] DG Chest [place] [address] [place], [place] Pelvis Portable, [place] Pelvis Portable  .Critical Care Performed by: Blane Ohara, MD Authorized by: Blane Ohara, MD   Critical care provider statement:    Critical care time (minutes):  75   Critical care start time:  12/08/2019 10:20 PM   Critical care end time:  12/08/2019 11:35 PM   Critical care time was exclusive of:  Separately billable procedures and treating other patients and teaching time   Critical care was necessary to treat or prevent imminent or life-threatening deterioration of the following conditions:  Trauma   Critical care was time spent personally by me on the following activities:  Discussions with consultants, evaluation of patient's response to treatment, examination of patient, ordering and performing treatments and interventions, ordering and review of laboratory studies, ordering and review of radiographic studies, pulse oximetry, re-evaluation of patient's condition, obtaining history from patient or surrogate and review of old charts      Blane Ohara, MD 12/09/19 [phone]
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
## 2019-12-08 NOTE — ED Notes (Signed)
Pt transported to CT ?

## 2019-12-08 NOTE — ED Notes (Signed)
Verbal order received by Resident for 50 mcg fent for CT scan.

## 2019-12-08 NOTE — ED Notes (Signed)
CT called informed pt ready for transport. CT to call back when ready for pt.
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
## 2019-12-09 ENCOUNTER — Encounter (HOSPITAL_COMMUNITY): Payer: Self-pay

## 2019-12-09 ENCOUNTER — Inpatient Hospital Stay (HOSPITAL_COMMUNITY): Payer: Medicaid Other

## 2019-12-09 DIAGNOSIS — T1490XA Injury, unspecified, initial encounter: Secondary | ICD-10-CM | POA: Diagnosis not present

## 2019-12-09 DIAGNOSIS — S27329A Contusion of lung, unspecified, initial encounter: Secondary | ICD-10-CM | POA: Diagnosis present

## 2019-12-09 DIAGNOSIS — S20319A Abrasion of unspecified front wall of thorax, initial encounter: Secondary | ICD-10-CM | POA: Diagnosis present

## 2019-12-09 DIAGNOSIS — M549 Dorsalgia, unspecified: Secondary | ICD-10-CM | POA: Diagnosis present

## 2019-12-09 DIAGNOSIS — D62 Acute posthemorrhagic anemia: Secondary | ICD-10-CM | POA: Diagnosis present

## 2019-12-09 DIAGNOSIS — Z833 Family history of diabetes mellitus: Secondary | ICD-10-CM | POA: Diagnosis not present

## 2019-12-09 DIAGNOSIS — S36116A Major laceration of liver, initial encounter: Secondary | ICD-10-CM | POA: Diagnosis not present

## 2019-12-09 DIAGNOSIS — Z20822 Contact with and (suspected) exposure to covid-19: Secondary | ICD-10-CM | POA: Diagnosis present

## 2019-12-09 DIAGNOSIS — Y929 Unspecified place or not applicable: Secondary | ICD-10-CM | POA: Diagnosis not present

## 2019-12-09 DIAGNOSIS — S272XXA Traumatic hemopneumothorax, initial encounter: Secondary | ICD-10-CM | POA: Diagnosis present

## 2019-12-09 DIAGNOSIS — F17210 Nicotine dependence, cigarettes, uncomplicated: Secondary | ICD-10-CM | POA: Diagnosis present

## 2019-12-09 DIAGNOSIS — S2243XA Multiple fractures of ribs, bilateral, initial encounter for closed fracture: Secondary | ICD-10-CM | POA: Diagnosis present

## 2019-12-09 DIAGNOSIS — Z8249 Family history of ischemic heart disease and other diseases of the circulatory system: Secondary | ICD-10-CM | POA: Diagnosis not present

## 2019-12-09 LAB — CBC
HCT: 32.1 % — ABNORMAL LOW (ref 36.0–46.0)
HCT: 31.4 % — ABNORMAL LOW (ref 36.0–46.0)
HCT: 33.1 % — ABNORMAL LOW (ref 36.0–46.0)
Hemoglobin: 9.5 g/dL — ABNORMAL LOW (ref 12.0–15.0)
Hemoglobin: 9.1 g/dL — ABNORMAL LOW (ref 12.0–15.0)
Hemoglobin: 9.9 g/dL — ABNORMAL LOW (ref 12.0–15.0)
MCH: 23.1 pg — ABNORMAL LOW (ref 26.0–34.0)
MCH: 23 pg — ABNORMAL LOW (ref 26.0–34.0)
MCH: 23.6 pg — ABNORMAL LOW (ref 26.0–34.0)
MCHC: 29.6 g/dL — ABNORMAL LOW (ref 30.0–36.0)
MCHC: 29 g/dL — ABNORMAL LOW (ref 30.0–36.0)
MCHC: 29.9 g/dL — ABNORMAL LOW (ref 30.0–36.0)
MCV: 78.1 fL — ABNORMAL LOW (ref 80.0–100.0)
MCV: 79.3 fL — ABNORMAL LOW (ref 80.0–100.0)
MCV: 78.8 fL — ABNORMAL LOW (ref 80.0–100.0)
Platelets: 169 10*3/uL (ref 150–400)
Platelets: 150 10*3/uL (ref 150–400)
Platelets: 159 10*3/uL (ref 150–400)
RBC: 4.11 MIL/uL (ref 3.87–5.11)
RBC: 3.96 MIL/uL (ref 3.87–5.11)
RBC: 4.2 MIL/uL (ref 3.87–5.11)
RDW: 16.4 % — ABNORMAL HIGH (ref 11.5–15.5)
RDW: 16.5 % — ABNORMAL HIGH (ref 11.5–15.5)
RDW: 16.2 % — ABNORMAL HIGH (ref 11.5–15.5)
WBC: 12.5 10*3/uL — ABNORMAL HIGH (ref 4.0–10.5)
WBC: 8.3 10*3/uL (ref 4.0–10.5)
WBC: 7.7 10*3/uL (ref 4.0–10.5)
nRBC: 0 % (ref 0.0–0.2)
nRBC: 0 % (ref 0.0–0.2)
nRBC: 0 % (ref 0.0–0.2)

## 2019-12-09 LAB — BASIC METABOLIC PANEL
Anion gap: 6 (ref 5–15)
Anion gap: 7 (ref 5–15)
BUN: 11 mg/dL (ref 6–20)
BUN: 9 mg/dL (ref 6–20)
CO2: 23 mmol/L (ref 22–32)
CO2: 24 mmol/L (ref 22–32)
Calcium: 8.3 mg/dL — ABNORMAL LOW (ref 8.9–10.3)
Calcium: 8.3 mg/dL — ABNORMAL LOW (ref 8.9–10.3)
Chloride: 109 mmol/L (ref 98–111)
Chloride: 106 mmol/L (ref 98–111)
Creatinine, Ser: 0.56 mg/dL (ref 0.44–1.00)
Creatinine, Ser: 0.51 mg/dL (ref 0.44–1.00)
GFR calc Af Amer: >60 mL/min (ref >60)
GFR calc Af Amer: >60 mL/min (ref >60)
GFR calc non Af Amer: >60 mL/min (ref >60)
GFR calc non Af Amer: >60 mL/min (ref >60)
Glucose, Bld: 108 mg/dL — ABNORMAL HIGH (ref 70–99)
Glucose, Bld: 101 mg/dL — ABNORMAL HIGH (ref 70–99)
Potassium: 3.7 mmol/L (ref 3.5–5.1)
Potassium: 3.6 mmol/L (ref 3.5–5.1)
Sodium: 138 mmol/L (ref 135–145)
Sodium: 137 mmol/L (ref 135–145)

## 2019-12-09 LAB — SARS CORONAVIRUS 2 BY RT PCR (HOSPITAL ORDER, PERFORMED IN ~~LOC~~ HOSPITAL LAB): SARS Coronavirus 2: NEGATIVE

## 2019-12-09 LAB — HIV ANTIBODY (ROUTINE TESTING W REFLEX): HIV Screen 4th Generation wRfx: NONREACTIVE

## 2019-12-09 LAB — MRSA PCR SCREENING: MRSA by PCR: NEGATIVE

## 2019-12-09 MED ORDER — ONDANSETRON 4 MG PO TBDP: 4.0000 mg | ORAL_TABLET | Freq: Four times a day (QID) | ORAL | Status: DC | PRN

## 2019-12-09 MED ORDER — METHOCARBAMOL 1000 MG/10ML IJ SOLN
1000.0000 mg | Freq: Three times a day (TID) | INTRAVENOUS | Status: DC | PRN
  Filled 2019-12-09 (×2): qty 10

## 2019-12-09 MED ORDER — CHLORHEXIDINE GLUCONATE CLOTH 2 % EX PADS
6.0000 | MEDICATED_PAD | Freq: Every day | CUTANEOUS | Status: DC
  Administered 2019-12-09 – 2019-12-11 (×3): 6 via TOPICAL

## 2019-12-09 MED ORDER — PROCHLORPERAZINE MALEATE 10 MG PO TABS
10.0000 mg | ORAL_TABLET | Freq: Four times a day (QID) | ORAL | Status: DC | PRN
  Filled 2019-12-09: qty 1

## 2019-12-09 MED ORDER — IBUPROFEN 200 MG PO TABS: 600.0000 mg | ORAL_TABLET | Freq: Four times a day (QID) | ORAL | Status: DC | PRN

## 2019-12-09 MED ORDER — OXYCODONE HCL 5 MG PO TABS
5.0000 mg | ORAL_TABLET | ORAL | Status: DC | PRN
  Administered 2019-12-09 (×3): 10 mg via ORAL
  Filled 2019-12-09 (×3): qty 2

## 2019-12-09 MED ORDER — ACETAMINOPHEN 325 MG PO TABS
650.0000 mg | ORAL_TABLET | Freq: Four times a day (QID) | ORAL | Status: DC
  Administered 2019-12-09 – 2019-12-10 (×4): 650 mg via ORAL
  Filled 2019-12-09 (×4): qty 2

## 2019-12-09 MED ORDER — PROCHLORPERAZINE EDISYLATE 10 MG/2ML IJ SOLN: 5.0000 mg | Freq: Four times a day (QID) | INTRAMUSCULAR | Status: DC | PRN

## 2019-12-09 MED ORDER — HYDROMORPHONE HCL 1 MG/ML IJ SOLN
0.5000 mg | INTRAMUSCULAR | Status: DC | PRN
  Administered 2019-12-09 – 2019-12-10 (×6): 1 mg via INTRAVENOUS
  Filled 2019-12-09 (×6): qty 1

## 2019-12-09 MED ORDER — ACETAMINOPHEN 325 MG PO TABS: 650.0000 mg | ORAL_TABLET | ORAL | Status: DC | PRN

## 2019-12-09 MED ORDER — DEXTROSE IN LACTATED RINGERS 5 % IV SOLN: INTRAVENOUS | Status: DC

## 2019-12-09 MED ORDER — ONDANSETRON HCL 4 MG/2ML IJ SOLN: 4.0000 mg | Freq: Four times a day (QID) | INTRAMUSCULAR | Status: DC | PRN
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
## 2019-12-09 NOTE — H&P (Signed)
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 Serial H&H Pt is without respiratory distress and is on [facility].  Will try to get by without chest tubes, but discussed with patient to be sure to let us know if she has acute severe worsening of pain or sensation of shortness of breath.   Pulse oximetry and telemetry.  Will repeat CXR later this AM. NPO Bed rest. Discussed importance of pulmonary  toilet. U/A ordered.  EtOH negative.     Milus Height, MD FACS Surgical Oncology, General Surgery, Trauma and [place] [facility], [place] for weekday/non holidays Check [email or web address] for coverage night/weekend/holidays  Do not use SecureChat as it is not reliable for timely patient care.
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
## 2019-12-10 ENCOUNTER — Inpatient Hospital Stay (HOSPITAL_COMMUNITY): Payer: Medicaid Other

## 2019-12-10 LAB — CBC
HCT: 31.7 % — ABNORMAL LOW (ref 36.0–46.0)
HCT: 33 % — ABNORMAL LOW (ref 36.0–46.0)
HCT: 32 % — ABNORMAL LOW (ref 36.0–46.0)
Hemoglobin: 9.5 g/dL — ABNORMAL LOW (ref 12.0–15.0)
Hemoglobin: 9.8 g/dL — ABNORMAL LOW (ref 12.0–15.0)
Hemoglobin: 9.6 g/dL — ABNORMAL LOW (ref 12.0–15.0)
MCH: 23.6 pg — ABNORMAL LOW (ref 26.0–34.0)
MCH: 23.2 pg — ABNORMAL LOW (ref 26.0–34.0)
MCH: 23.4 pg — ABNORMAL LOW (ref 26.0–34.0)
MCHC: 30 g/dL (ref 30.0–36.0)
MCHC: 29.7 g/dL — ABNORMAL LOW (ref 30.0–36.0)
MCHC: 30 g/dL (ref 30.0–36.0)
MCV: 78.7 fL — ABNORMAL LOW (ref 80.0–100.0)
MCV: 78.2 fL — ABNORMAL LOW (ref 80.0–100.0)
MCV: 78 fL — ABNORMAL LOW (ref 80.0–100.0)
Platelets: 140 10*3/uL — ABNORMAL LOW (ref 150–400)
Platelets: 151 10*3/uL (ref 150–400)
Platelets: 151 10*3/uL (ref 150–400)
RBC: 4.03 MIL/uL (ref 3.87–5.11)
RBC: 4.22 MIL/uL (ref 3.87–5.11)
RBC: 4.1 MIL/uL (ref 3.87–5.11)
RDW: 16.1 % — ABNORMAL HIGH (ref 11.5–15.5)
RDW: 15.9 % — ABNORMAL HIGH (ref 11.5–15.5)
RDW: 15.8 % — ABNORMAL HIGH (ref 11.5–15.5)
WBC: 8.7 10*3/uL (ref 4.0–10.5)
WBC: 8.3 10*3/uL (ref 4.0–10.5)
WBC: 8.1 10*3/uL (ref 4.0–10.5)
nRBC: 0 % (ref 0.0–0.2)
nRBC: 0 % (ref 0.0–0.2)
nRBC: 0 % (ref 0.0–0.2)

## 2019-12-10 MED ORDER — OXYCODONE HCL 5 MG PO TABS
10.0000 mg | ORAL_TABLET | ORAL | Status: DC | PRN
  Administered 2019-12-10 (×3): 15 mg via ORAL
  Administered 2019-12-11 (×2): 10 mg via ORAL
  Administered 2019-12-11: 15 mg via ORAL
  Administered 2019-12-11 – 2019-12-12 (×4): 10 mg via ORAL
  Filled 2019-12-10 (×2): qty 3
  Filled 2019-12-10 (×4): qty 2
  Filled 2019-12-10 (×2): qty 3
  Filled 2019-12-10: qty 2
  Filled 2019-12-10: qty 3
  Filled 2019-12-10 (×2): qty 2

## 2019-12-10 MED ORDER — METHOCARBAMOL 1000 MG/10ML IJ SOLN
1000.0000 mg | Freq: Three times a day (TID) | INTRAVENOUS | Status: DC
  Filled 2019-12-10 (×5): qty 10

## 2019-12-10 MED ORDER — ACETAMINOPHEN 500 MG PO TABS
1000.0000 mg | ORAL_TABLET | Freq: Four times a day (QID) | ORAL | Status: DC
  Administered 2019-12-10 – 2019-12-12 (×8): 1000 mg via ORAL
  Filled 2019-12-10 (×10): qty 2

## 2019-12-10 MED ORDER — HYDROMORPHONE HCL 1 MG/ML IJ SOLN
0.5000 mg | INTRAMUSCULAR | Status: DC | PRN
  Administered 2019-12-10 (×3): 0.5 mg via INTRAVENOUS
  Filled 2019-12-10 (×3): qty 1

## 2019-12-10 MED ORDER — DOCUSATE SODIUM 100 MG PO CAPS
100.0000 mg | ORAL_CAPSULE | Freq: Two times a day (BID) | ORAL | Status: DC
  Administered 2019-12-10 – 2019-12-11 (×3): 100 mg via ORAL
  Filled 2019-12-10 (×4): qty 1
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
## 2019-12-10 NOTE — TOC CAGE-AID Note (Signed)
Transition of Care [facility]) - CAGE-AID Screening   Patient Details  Name: Janice Smith MRN: [id number] Date of Birth: 1970-08-26  Transition of Care [facility]) CM/SW Contact:    Jimmy Picket, LCSWA Phone Number: 12/10/2019, 3:13 PM   Clinical Narrative:  Pt denied alcohol and substance use.  CAGE-AID Screening:    Have You Ever Felt You Ought to Cut Down on Your Drinking or Drug Use?: No Have People Annoyed You By Critizing Your Drinking Or Drug Use?: No Have You Felt Bad Or Guilty About Your Drinking Or Drug Use?: No Have You Ever Had a Drink or Used Drugs First Thing In The Morning to STeady Your Nerves or to Get Rid of a Hangover?: No CAGE-AID Score: 0  Substance Abuse Education Offered: No    Denita Lung, Bridget Hartshorn Clinical Social Worker [phone]

## 2019-12-11 ENCOUNTER — Inpatient Hospital Stay (HOSPITAL_COMMUNITY): Payer: Medicaid Other

## 2019-12-11 LAB — CBC
HCT: 29.6 % — ABNORMAL LOW (ref 36.0–46.0)
HCT: 29.7 % — ABNORMAL LOW (ref 36.0–46.0)
Hemoglobin: 9.1 g/dL — ABNORMAL LOW (ref 12.0–15.0)
Hemoglobin: 8.9 g/dL — ABNORMAL LOW (ref 12.0–15.0)
MCH: 23.8 pg — ABNORMAL LOW (ref 26.0–34.0)
MCH: 23.2 pg — ABNORMAL LOW (ref 26.0–34.0)
MCHC: 30.7 g/dL (ref 30.0–36.0)
MCHC: 30 g/dL (ref 30.0–36.0)
MCV: 77.5 fL — ABNORMAL LOW (ref 80.0–100.0)
MCV: 77.5 fL — ABNORMAL LOW (ref 80.0–100.0)
Platelets: 153 10*3/uL (ref 150–400)
Platelets: 160 10*3/uL (ref 150–400)
RBC: 3.82 MIL/uL — ABNORMAL LOW (ref 3.87–5.11)
RBC: 3.83 MIL/uL — ABNORMAL LOW (ref 3.87–5.11)
RDW: 15.6 % — ABNORMAL HIGH (ref 11.5–15.5)
RDW: 15.9 % — ABNORMAL HIGH (ref 11.5–15.5)
WBC: 8.2 10*3/uL (ref 4.0–10.5)
WBC: 5.9 10*3/uL (ref 4.0–10.5)
nRBC: 0 % (ref 0.0–0.2)
nRBC: 0 % (ref 0.0–0.2)

## 2019-12-11 MED ORDER — METHOCARBAMOL 500 MG PO TABS
1000.0000 mg | ORAL_TABLET | Freq: Three times a day (TID) | ORAL | Status: DC
  Administered 2019-12-11 – 2019-12-12 (×5): 1000 mg via ORAL
  Filled 2019-12-11 (×6): qty 2
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
## 2019-12-11 NOTE — Progress Notes (Signed)
OT Cancellation Note  Patient Details Name: Janice Smith MRN: [id number] DOB: 14-Aug-1970   Cancelled Treatment:    Reason Eval/Treat Not Completed: Patient not medically ready Pt with BP 81/57, RN spoke with PT and requested to hold off on therapy at this time. Will continue to attempt to see pt as soon as she is appropriate and as time allows.   [facility] OTR/L Acute Rehabilitation Services Office: [phone]   Rebeca Alert 12/11/2019, 9:13 AM
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
## 2019-12-11 NOTE — Progress Notes (Signed)
PT Cancellation Note  Patient Details Name: Janice Smith MRN: [id number] DOB: 1971-05-21   Cancelled Treatment:    Reason Eval/Treat Not Completed: Patient not medically ready   Received word this patient needs to be seen this a.m. as she may be able to discharge today. Noted BP 81/57 and spoke with RN by phone. She asked PT to hold off until she can further assess patient. Will continue to attempt to see as early as is appropriate.   Jerolyn Center, PT Pager [phone]    Zena Amos 12/11/2019, 8:47 AM
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
## 2019-12-12 LAB — CBC
HCT: 32.3 % — ABNORMAL LOW (ref 36.0–46.0)
Hemoglobin: 9.7 g/dL — ABNORMAL LOW (ref 12.0–15.0)
MCH: 23.4 pg — ABNORMAL LOW (ref 26.0–34.0)
MCHC: 30 g/dL (ref 30.0–36.0)
MCV: 77.8 fL — ABNORMAL LOW (ref 80.0–100.0)
Platelets: 194 10*3/uL (ref 150–400)
RBC: 4.15 MIL/uL (ref 3.87–5.11)
RDW: 16 % — ABNORMAL HIGH (ref 11.5–15.5)
WBC: 6.5 10*3/uL (ref 4.0–10.5)
nRBC: 0 % (ref 0.0–0.2)

## 2019-12-12 MED ORDER — OXYCODONE HCL 10 MG PO TABS: 10.0000 mg | ORAL_TABLET | ORAL | 0 refills | Status: AC | PRN

## 2019-12-12 MED ORDER — ACETAMINOPHEN 500 MG PO TABS: 1000.0000 mg | ORAL_TABLET | Freq: Four times a day (QID) | ORAL | 0 refills | Status: AC | PRN

## 2019-12-12 MED ORDER — METHOCARBAMOL 500 MG PO TABS: 1000.0000 mg | ORAL_TABLET | Freq: Three times a day (TID) | ORAL | 0 refills | Status: AC
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
## 2019-12-12 NOTE — Progress Notes (Signed)
Nsg Discharge Note  Admit Date:  12/08/2019 Discharge date: 12/12/2019   Janice Smith to be D/C'd Home per MD order.  AVS completed.  Patient/caregiver able to verbalize understanding.  Discharge Medication: Allergies as of 12/12/2019   No Known Allergies     Medication List    STOP taking these medications   cyclobenzaprine 10 MG tablet Commonly known as: FLEXERIL     TAKE these medications   acetaminophen 500 MG tablet Commonly known as: TYLENOL Take 2 tablets (1,000 mg total) by mouth every 6 (six) hours as needed.   ibuprofen 200 MG tablet Commonly known as: ADVIL Take 800 mg by mouth 2 (two) times daily as needed.   methocarbamol 500 MG tablet Commonly known as: ROBAXIN Take 2 tablets (1,000 mg total) by mouth every 8 (eight) hours. Notes to patient: Tonight, 12/12/19.   Oxycodone HCl 10 MG Tabs Take 1-1.5 tablets (10-15 mg total) by mouth every 4 (four) hours as needed for moderate pain or severe pain.            Durable Medical Equipment  (From admission, onward)         Start     Ordered   12/12/19 1022  For home use only DME 3 n 1  Once     12/12/19 1021          Discharge Assessment: Vitals:   12/12/19 0919 12/12/19 1134  BP: (!) 102/43 (!) 120/58  Pulse: 70 68  Resp:  17  Temp: 97.8 F (36.6 C) 98.1 F (36.7 C)  SpO2:  94%   Skin clean, dry and intact without evidence of skin break down, no evidence of skin tears noted. IV catheter discontinued intact. Site without signs and symptoms of complications - no redness or edema noted at insertion site, patient denies c/o pain - only slight tenderness at site.  Dressing with slight pressure applied.  D/c Instructions-Education: Discharge instructions given to patient/family with verbalized understanding. D/c education completed with patient/family including follow up instructions, medication list, d/c activities limitations if indicated, with other d/c instructions as indicated by MD - patient able to  verbalize understanding, all questions fully answered. Patient instructed to return to ED, call 911, or call MD for any changes in condition.  Patient escorted via WC, and D/C home via private auto.  Luellen Pucker, RN 12/12/2019 1:55 PM

## 2019-12-12 NOTE — Plan of Care (Signed)
  Problem: Education: Goal: Knowledge of General Education information will improve Description: Including pain rating scale, medication(s)/side effects and non-pharmacologic comfort measures Outcome: Progressing   Problem: Health Behavior/Discharge Planning: Goal: Ability to manage health-related needs will improve Outcome: Progressing  Patient verbalizes understanding of DME delivery for discharge.  Problem: Activity: Goal: Risk for activity intolerance will decrease Outcome: Progressing  Patient plans out ADL's to minimize discomfort, accepts help from staff to transfer.  Problem: Nutrition: Goal: Adequate nutrition will be maintained Outcome: Progressing  Patient tolerates >50% ofmeals, denies nausea.

## 2019-12-12 NOTE — Discharge Instructions (Signed)
Liver Laceration  A liver laceration is a tear or a cut in the liver. The liver is an organ that is involved in many important bodily functions. Sometimes, a liver laceration can be a very serious injury. It can cause a lot of bleeding, and surgery may be needed. Other times, a liver laceration may be minor, and bed rest may be all that is needed. Either way, treatment in a hospital is almost always required. Liver lacerations are categorized in grades from 1 to 5. Low numbers identify lacerations that are less severe than lacerations with high numbers.  Grade 1: This is a tear in the outer lining of the liver. It is less than  inch (1 cm) deep.  Grade 2: This is a tear that is about  inch to 1 inch (1 to 3 cm) deep. It is less than 4 inches (10 cm) long.  Grade 3: This is a tear that is slightly more than 1 inch (3 cm) deep.  Grades 4 and 5: These lacerations are very deep. They affect a large part of the liver. What are the causes? This condition may be caused by:  A forceful hit to the area around the liver (blunt trauma), such as in a car crash. Blunt trauma can tear the liver even though it does not break the skin.  An injury in which an object goes through the skin and into the liver (penetrating injury), such as a stab or gunshot wound. What are the signs or symptoms? Common symptoms of this condition include:  A swollen and firm abdomen.  Pain in the abdomen.  Tenderness when pressing on the right side of the abdomen. Other symptoms include:  Bleeding from a penetrating wound.  Bruises on the abdomen.  A fast heartbeat.  Taking quick breaths.  Feeling weak and dizzy. How is this diagnosed? To diagnose this condition, your health care provider will do a physical exam and ask about any injuries to the right side of your abdomen. You may have various tests, such as:  Blood tests. Your blood may be tested every few hours. This will show whether you are losing  blood.  CT scan. This test is done to check for laceration or bleeding.  Laparoscopy. This involves placing a small camera into the abdomen and looking directly at the surface of the liver. How is this treated? Treatment depends on how deep the laceration is and how much bleeding you have. Treatment options include:  Monitoring and bed rest at the hospital. You will have tests often.  Receiving donated blood through an IV tube (transfusion) to replace blood that you have lost. You may need several transfusions.  Surgery to pack gauze pads or special material around the laceration to help it heal or to repair the laceration. Follow these instructions at home:  Take over-the-counter and prescription medicines only as told by your health care provider. Do not take any other medicines unless you ask your health care provider about them first.  Do not drive or use heavy machinery while taking prescription pain medicines.  Rest and limit your activity as told by your health care provider. It may be several months before you can return to your usual routine. Do not participate in activities that involve physical contact or require extra energy until your health care provider approves.  Keep all follow-up visits as told by your health care provider. This is important. Contact a health care provider if:  Your abdominal pain does not go  away.  You feel more weak and tired than usual. Get help right away if:  Your abdominal pain gets worse.  You have a cut on your skin that: ? Has more redness, swelling, or pain around it. ? Has more fluid or blood coming from it. ? Feels warm to the touch. ? Has pus or a bad smell coming from it.  You feel dizzy or very weak.  You have trouble breathing.  You have a fever. This information is not intended to replace advice given to you by your health care provider. Make sure you discuss any questions you have with your health care provider. Document  Revised: 06/03/2017 Document Reviewed: 02/06/2016 Elsevier Patient Education  2020 [employer] Inc[employer]   Rib Fracture  A rib fracture is a break or crack in one of the bones of the ribs. The ribs are like a cage that goes around your upper chest. A broken or cracked rib is often painful, but most do not cause other problems. Most rib fractures usually heal on their own in 1-3 months. Follow these instructions at home: Managing pain, stiffness, and swelling  If directed, apply ice to the injured area. ? Put ice in a plastic bag. ? Place a towel between your skin and the bag. ? Leave the ice on for 20 minutes, 2-3 times a day.  Take over-the-counter and prescription medicines only as told by your doctor. Activity  Avoid activities that cause pain to the injured area. Protect your injured area.  Slowly increase activity as told by your doctor. General instructions  Do deep breathing as told by your doctor. You may be told to: ? Take deep breaths many times a day. ? Cough many times a day while hugging a pillow. ? Use a device (incentive spirometer) to do deep breathing many times a day.  Drink enough fluid to keep your pee (urine) clear or pale yellow.  Do not wear a rib belt or binder. These do not allow you to breathe deeply.  Keep all follow-up visits as told by your doctor. This is important. Contact a doctor if:  You have a fever. Get help right away if:  You have trouble breathing.  You are short of breath.  You cannot stop coughing.  You cough up thick or bloody spit (sputum).  You feel sick to your stomach (nauseous), throw up (vomit), or have belly (abdominal) pain.  Your pain gets worse and medicine does not help. Summary  A rib fracture is a break or crack in one of the bones of the ribs.  Apply ice to the injured area and take medicines for pain as told by your doctor.  Take deep breaths and cough many times a day. Hug a pillow every time you cough. This  information is not intended to replace advice given to you by your health care provider. Make sure you discuss any questions you have with your health care provider. Document Revised: 06/03/2017 Document Reviewed: 09/21/2016 Elsevier Patient Education  2020 [employer].
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
## 2019-12-12 NOTE — Plan of Care (Signed)
  Problem: Education: Goal: Knowledge of General Education information will improve Description: Including pain rating scale, medication(s)/side effects and non-pharmacologic comfort measures 12/12/2019 1320 by Luellen Pucker, RN Outcome: Adequate for Discharge 12/12/2019 1205 by Luellen Pucker, RN Outcome: Progressing   Problem: Health Behavior/Discharge Planning: Goal: Ability to manage health-related needs will improve 12/12/2019 1320 by Luellen Pucker, RN Outcome: Adequate for Discharge 12/12/2019 1205 by Luellen Pucker, RN Outcome: Progressing   Problem: Clinical Measurements: Goal: Ability to maintain clinical measurements within normal limits will improve Outcome: Adequate for Discharge Goal: Will remain free from infection Outcome: Adequate for Discharge Goal: Diagnostic test results will improve Outcome: Adequate for Discharge Goal: Respiratory complications will improve Outcome: Adequate for Discharge Goal: Cardiovascular complication will be avoided Outcome: Adequate for Discharge   Problem: Activity: Goal: Risk for activity intolerance will decrease 12/12/2019 1320 by Luellen Pucker, RN Outcome: Adequate for Discharge 12/12/2019 1205 by Luellen Pucker, RN Outcome: Progressing   Problem: Nutrition: Goal: Adequate nutrition will be maintained 12/12/2019 1320 by Luellen Pucker, RN Outcome: Adequate for Discharge 12/12/2019 1205 by Luellen Pucker, RN Outcome: Progressing   Problem: Coping: Goal: Level of anxiety will decrease Outcome: Adequate for Discharge   Problem: Elimination: Goal: Will not experience complications related to bowel motility Outcome: Adequate for Discharge Goal: Will not experience complications related to urinary retention Outcome: Adequate for Discharge   Problem: Pain Managment: Goal: General experience of comfort will improve Outcome: Adequate for Discharge   Problem: Safety: Goal: Ability to remain free from injury will improve Outcome:  Adequate for Discharge   Problem: Skin Integrity: Goal: Risk for impaired skin integrity will decrease Outcome: Adequate for Discharge

## 2019-12-12 NOTE — TOC Transition Note (Signed)
Transition of Care [facility]) - CM/SW Discharge Note   Patient Details  Name: Janice Smith MRN: [id number] Date of Birth: [birth year]  Transition [facility] CM/SW Contact:  Glennon Mac, RN Phone Number: 12/12/2019, 3:31 PM   Clinical Narrative:  Pt medically stable for discharge home today with mother and other family members to assist.  Referral to [employer] [facility] for 3 in 1 [facility], to be delivered prior to dc home.  No other dc needs identified.       Final next level of care: Home/Self Care Barriers to Discharge: Barriers Resolved                         Discharge Plan and Services   Discharge Planning Services: CM Consult            DME Arranged: 3-N-1 DME Agency: [employer] Date DME Agency Contacted: 12/12/19 Time DME Agency Contacted: 1100 Representative spoke with at DME Agency: Oletha Cruel            Social Determinants of Health (SDOH) Interventions     Readmission Risk Interventions No flowsheet data found.  Quintella Baton, RN, BSN  Trauma/Neuro ICU Case Manager [phone]
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
## 2019-12-13 ENCOUNTER — Encounter (HOSPITAL_COMMUNITY): Payer: Self-pay | Admitting: Emergency Medicine
